# Patient Record
Sex: Female | Born: 1970
Health system: Southern US, Community
[De-identification: ages and names within clinical notes are randomized; demographics above are authoritative.]

## PROBLEM LIST (undated history)

## (undated) DIAGNOSIS — M545 Low back pain: Secondary | ICD-10-CM

## (undated) DIAGNOSIS — M3500 Sicca syndrome, unspecified: Secondary | ICD-10-CM

## (undated) DIAGNOSIS — R55 Syncope and collapse: Secondary | ICD-10-CM

## (undated) DIAGNOSIS — E119 Type 2 diabetes mellitus without complications: Secondary | ICD-10-CM

## (undated) DIAGNOSIS — I011 Acute rheumatic endocarditis: Secondary | ICD-10-CM

## (undated) DIAGNOSIS — D693 Immune thrombocytopenic purpura: Secondary | ICD-10-CM

## (undated) HISTORY — PX: HERNIA REPAIR: SHX51

## (undated) HISTORY — DX: Acute rheumatic endocarditis: I01.1

## (undated) HISTORY — DX: Syncope and collapse: R55

## (undated) HISTORY — PX: FOOT SURGERY: SHX648

## (undated) HISTORY — DX: Low back pain: M54.5

---

## 2004-02-22 ENCOUNTER — Other Ambulatory Visit: Admission: RE | Admit: 2004-02-22 | Discharge: 2004-02-22 | Payer: Self-pay | Admitting: Family Medicine

## 2004-09-20 ENCOUNTER — Emergency Department (HOSPITAL_COMMUNITY): Admission: EM | Admit: 2004-09-20 | Discharge: 2004-09-20 | Payer: Self-pay | Admitting: Emergency Medicine

## 2005-03-01 ENCOUNTER — Ambulatory Visit: Payer: Self-pay | Admitting: Oncology

## 2005-07-24 ENCOUNTER — Ambulatory Visit: Payer: Self-pay | Admitting: Oncology

## 2006-01-08 ENCOUNTER — Other Ambulatory Visit: Admission: RE | Admit: 2006-01-08 | Discharge: 2006-01-08 | Payer: Self-pay | Admitting: Family Medicine

## 2007-01-11 ENCOUNTER — Other Ambulatory Visit: Admission: RE | Admit: 2007-01-11 | Discharge: 2007-01-11 | Payer: Self-pay | Admitting: Family Medicine

## 2007-03-05 ENCOUNTER — Ambulatory Visit: Payer: Self-pay | Admitting: Oncology

## 2007-03-07 LAB — CBC WITH DIFFERENTIAL/PLATELET
Basophils Absolute: 0 10*3/uL (ref 0.0–0.1)
EOS%: 0.4 % (ref 0.0–7.0)
Eosinophils Absolute: 0 10*3/uL (ref 0.0–0.5)
HGB: 12.4 g/dL (ref 11.6–15.9)
MCV: 86.5 fL (ref 81.0–101.0)
MONO%: 9.7 % (ref 0.0–13.0)
NEUT#: 4.3 10*3/uL (ref 1.5–6.5)
RBC: 4.13 10*6/uL (ref 3.70–5.32)
RDW: 12.9 % (ref 11.3–14.5)
WBC: 5.7 10*3/uL (ref 3.9–10.0)
lymph#: 0.7 10*3/uL — ABNORMAL LOW (ref 0.9–3.3)

## 2007-03-07 LAB — CHCC SMEAR

## 2008-02-27 ENCOUNTER — Other Ambulatory Visit: Admission: RE | Admit: 2008-02-27 | Discharge: 2008-02-27 | Payer: Self-pay | Admitting: Family Medicine

## 2009-03-30 ENCOUNTER — Other Ambulatory Visit: Admission: RE | Admit: 2009-03-30 | Discharge: 2009-03-30 | Payer: Self-pay | Admitting: Obstetrics and Gynecology

## 2011-07-14 ENCOUNTER — Other Ambulatory Visit: Payer: Self-pay | Admitting: Family Medicine

## 2011-07-14 DIAGNOSIS — G44009 Cluster headache syndrome, unspecified, not intractable: Secondary | ICD-10-CM

## 2011-07-19 ENCOUNTER — Ambulatory Visit
Admission: RE | Admit: 2011-07-19 | Discharge: 2011-07-19 | Disposition: A | Discharge status: Home / Self Care | Payer: 59 | Source: Ambulatory Visit | Attending: Family Medicine | Admitting: Family Medicine

## 2011-07-19 DIAGNOSIS — G44009 Cluster headache syndrome, unspecified, not intractable: Secondary | ICD-10-CM

## 2014-09-10 ENCOUNTER — Emergency Department (HOSPITAL_BASED_OUTPATIENT_CLINIC_OR_DEPARTMENT_OTHER)
Admission: EM | Admit: 2014-09-10 | Discharge: 2014-09-10 | Disposition: A | Discharge status: Home / Self Care | Payer: BC Managed Care – PPO | Attending: Emergency Medicine | Admitting: Emergency Medicine

## 2014-09-10 ENCOUNTER — Emergency Department (HOSPITAL_BASED_OUTPATIENT_CLINIC_OR_DEPARTMENT_OTHER): Payer: BC Managed Care – PPO

## 2014-09-10 ENCOUNTER — Encounter (HOSPITAL_BASED_OUTPATIENT_CLINIC_OR_DEPARTMENT_OTHER): Payer: Self-pay

## 2014-09-10 DIAGNOSIS — M62830 Muscle spasm of back: Secondary | ICD-10-CM | POA: Diagnosis not present

## 2014-09-10 DIAGNOSIS — Z79899 Other long term (current) drug therapy: Secondary | ICD-10-CM | POA: Insufficient documentation

## 2014-09-10 DIAGNOSIS — Z880 Allergy status to penicillin: Secondary | ICD-10-CM | POA: Insufficient documentation

## 2014-09-10 DIAGNOSIS — R197 Diarrhea, unspecified: Secondary | ICD-10-CM | POA: Diagnosis not present

## 2014-09-10 DIAGNOSIS — R109 Unspecified abdominal pain: Secondary | ICD-10-CM

## 2014-09-10 DIAGNOSIS — R55 Syncope and collapse: Secondary | ICD-10-CM | POA: Diagnosis present

## 2014-09-10 DIAGNOSIS — R1030 Lower abdominal pain, unspecified: Secondary | ICD-10-CM | POA: Insufficient documentation

## 2014-09-10 DIAGNOSIS — M545 Low back pain, unspecified: Secondary | ICD-10-CM

## 2014-09-10 DIAGNOSIS — Z862 Personal history of diseases of the blood and blood-forming organs and certain disorders involving the immune mechanism: Secondary | ICD-10-CM | POA: Insufficient documentation

## 2014-09-10 DIAGNOSIS — Z3202 Encounter for pregnancy test, result negative: Secondary | ICD-10-CM | POA: Diagnosis not present

## 2014-09-10 HISTORY — DX: Immune thrombocytopenic purpura: D69.3

## 2014-09-10 HISTORY — DX: Sjogren syndrome, unspecified: M35.00

## 2014-09-10 LAB — URINALYSIS, ROUTINE W REFLEX MICROSCOPIC
Bilirubin Urine: NEGATIVE
Glucose, UA: NEGATIVE mg/dL
Hgb urine dipstick: NEGATIVE
KETONES UR: NEGATIVE mg/dL
LEUKOCYTES UA: NEGATIVE
NITRITE: NEGATIVE
PROTEIN: NEGATIVE mg/dL
SPECIFIC GRAVITY, URINE: 1.012 (ref 1.005–1.030)
Urobilinogen, UA: 0.2 mg/dL (ref 0.0–1.0)
pH: 6.5 (ref 5.0–8.0)

## 2014-09-10 LAB — CBC WITH DIFFERENTIAL/PLATELET
BASOS ABS: 0 10*3/uL (ref 0.0–0.1)
Basophils Relative: 0 % (ref 0–1)
EOS ABS: 0 10*3/uL (ref 0.0–0.7)
Eosinophils Relative: 0 % (ref 0–5)
HCT: 37.9 % (ref 36.0–46.0)
Hemoglobin: 12.5 g/dL (ref 12.0–15.0)
LYMPHS PCT: 6 % — AB (ref 12–46)
Lymphs Abs: 0.5 10*3/uL — ABNORMAL LOW (ref 0.7–4.0)
MCH: 30 pg (ref 26.0–34.0)
MCHC: 33 g/dL (ref 30.0–36.0)
MCV: 90.9 fL (ref 78.0–100.0)
MONO ABS: 0.4 10*3/uL (ref 0.1–1.0)
Monocytes Relative: 5 % (ref 3–12)
NEUTROS PCT: 89 % — AB (ref 43–77)
Neutro Abs: 7.9 10*3/uL — ABNORMAL HIGH (ref 1.7–7.7)
PLATELETS: 89 10*3/uL — AB (ref 150–400)
RBC: 4.17 MIL/uL (ref 3.87–5.11)
RDW: 14 % (ref 11.5–15.5)
WBC: 8.8 10*3/uL (ref 4.0–10.5)

## 2014-09-10 LAB — COMPREHENSIVE METABOLIC PANEL
ALBUMIN: 3.9 g/dL (ref 3.5–5.2)
ALT: 17 U/L (ref 0–35)
AST: 22 U/L (ref 0–37)
Alkaline Phosphatase: 38 U/L — ABNORMAL LOW (ref 39–117)
Anion gap: 7 (ref 5–15)
BUN: 18 mg/dL (ref 6–23)
CALCIUM: 9.6 mg/dL (ref 8.4–10.5)
CO2: 23 mmol/L (ref 19–32)
Chloride: 107 mEq/L (ref 96–112)
Creatinine, Ser: 0.77 mg/dL (ref 0.50–1.10)
GFR calc non Af Amer: >90 mL/min (ref >90)
Glucose, Bld: 107 mg/dL — ABNORMAL HIGH (ref 70–99)
Potassium: 4.3 mmol/L (ref 3.5–5.1)
SODIUM: 137 mmol/L (ref 135–145)
TOTAL PROTEIN: 7.6 g/dL (ref 6.0–8.3)
Total Bilirubin: 0.6 mg/dL (ref 0.3–1.2)

## 2014-09-10 LAB — PREGNANCY, URINE: PREG TEST UR: NEGATIVE

## 2014-09-10 MED ORDER — IOHEXOL 300 MG/ML  SOLN
25.0000 mL | Freq: Once | INTRAMUSCULAR | Status: AC | PRN
  Administered 2014-09-10: 25 mL via ORAL

## 2014-09-10 MED ORDER — CYCLOBENZAPRINE HCL 5 MG PO TABS: 5.0000 mg | ORAL_TABLET | Freq: Three times a day (TID) | ORAL | Status: DC | PRN

## 2014-09-10 MED ORDER — IOHEXOL 300 MG/ML  SOLN
100.0000 mL | Freq: Once | INTRAMUSCULAR | Status: AC | PRN
  Administered 2014-09-10: 100 mL via INTRAVENOUS

## 2014-09-10 MED ORDER — HYDROCODONE-ACETAMINOPHEN 5-325 MG PO TABS
1.0000 | ORAL_TABLET | Freq: Once | ORAL | Status: AC
  Administered 2014-09-10: 1 via ORAL
  Filled 2014-09-10: qty 1

## 2014-09-10 MED ORDER — HYDROCODONE-ACETAMINOPHEN 5-325 MG PO TABS: 1.0000 | ORAL_TABLET | Freq: Once | ORAL | Status: DC

## 2014-09-10 MED ORDER — ONDANSETRON HCL 4 MG/2ML IJ SOLN
4.0000 mg | Freq: Once | INTRAMUSCULAR | Status: AC
  Administered 2014-09-10: 4 mg via INTRAVENOUS
  Filled 2014-09-10: qty 2

## 2014-09-10 MED ORDER — MORPHINE SULFATE 4 MG/ML IJ SOLN
4.0000 mg | Freq: Once | INTRAMUSCULAR | Status: AC
  Administered 2014-09-10: 4 mg via INTRAVENOUS
  Filled 2014-09-10: qty 1

## 2014-09-10 MED ORDER — DIAZEPAM 5 MG PO TABS
5.0000 mg | ORAL_TABLET | Freq: Once | ORAL | Status: AC
  Administered 2014-09-10: 5 mg via ORAL
  Filled 2014-09-10: qty 1

## 2014-09-10 NOTE — ED Notes (Signed)
Patient preferred to walk to room rather than by wheelchair. Patient states she is unable to sit in chair, just by side of bed, unable to lay flat.

## 2014-09-10 NOTE — ED Provider Notes (Signed)
CSN: [id number]     Arrival date & time 09/10/14  1257 History   First MD Initiated Contact with Patient 09/10/14 1332     Chief Complaint  Patient presents with  . Near Syncope     (Consider location/radiation/quality/duration/timing/severity/associated sxs/prior Treatment) HPI Comments: Pt states she has some low back pain this morning when she got up.  She got in the shower and suddenly she developed a severe pain in the lower back and lower abd with near syncope (vision going black, nausea, sweating, turning pale and diarrhea).  The severe pain resolved after a few min but she had multiple episodes over the last 2 hours.  Pain is worse with movement and walking but a severe episode can happen with sitting still.  More pelvic pain bilaterally with sitting.  No prior episodes of pain like this.  No prior hx of pelvic pathology.  No urinary sx, fever or bloody stool  Patient is a 43 y.o. female presenting with near-syncope. The history is provided by the patient.  Near Syncope This is a new problem. The current episode started 1 to 2 hours ago. Episode frequency: 3-4 episodes over the last 2 hours. The problem has not changed since onset.Associated symptoms include abdominal pain. Pertinent negatives include no chest pain and no shortness of breath. The symptoms are aggravated by walking and bending. Nothing relieves the symptoms. She has tried rest for the symptoms. The treatment provided no relief.    Past Medical History  Diagnosis Date  . ITP (idiopathic thrombocytopenic purpura)   . Sjoegren syndrome    Past Surgical History  Procedure Laterality Date  . Hernia repair    . Foot surgery     No family history on file. History  Substance Use Topics  . Smoking status: Never Smoker   . Smokeless tobacco: Not on file  . Alcohol Use: No   OB History    No data available     Review of Systems  Constitutional: Negative for fever and chills.  Respiratory: Negative for cough and  shortness of breath.   Cardiovascular: Positive for near-syncope. Negative for chest pain and leg swelling.  Gastrointestinal: Positive for abdominal pain.  Genitourinary: Negative for dysuria and difficulty urinating.  All other systems reviewed and are negative.     Allergies  Penicillins  Home Medications   Prior to Admission medications   Medication Sig Start Date End Date Taking? Authorizing Provider  hydroxychloroquine (PLAQUENIL) 200 MG tablet Take 200 mg by mouth daily.   Yes Historical Provider, MD   BP 100/58 mmHg  Pulse 86  Temp(Src) 99 F (37.2 C) (Oral)  Resp 20  Ht 5\' 5"  (1.651 m)  Wt 125 lb (56.7 kg)  BMI 20.80 kg/m2  SpO2 100%  LMP 08/26/2014 (Approximate) Physical Exam  Constitutional: She is oriented to person, place, and time. She appears well-developed and well-nourished. No distress.  Appears uncomfortable on exam especially with movement  HENT:  Head: Normocephalic and atraumatic.  Mouth/Throat: Oropharynx is clear and moist.  Eyes: Conjunctivae and EOM are normal. Pupils are equal, round, and reactive to light.  Neck: Normal range of motion. Neck supple.  Cardiovascular: Normal rate, regular rhythm and intact distal pulses.   No murmur heard. Pulmonary/Chest: Effort normal and breath sounds normal. No respiratory distress. She has no wheezes. She has no rales.  Abdominal: Soft. She exhibits no distension. There is tenderness in the suprapubic area. There is CVA tenderness. There is no rebound and no guarding.  Suprapubic and pelvic tenderness.  Only mild upper abd tenderness.  Mild CVA tenderness  Musculoskeletal: Normal range of motion. She exhibits no edema or tenderness.       Back:  Neurological: She is alert and oriented to person, place, and time.  Skin: Skin is warm and dry. No rash noted. No erythema.  Psychiatric: She has a normal mood and affect. Her behavior is normal.  Nursing note and vitals reviewed.   ED Course  Procedures  (including critical care time) Labs Review Labs Reviewed  CBC WITH DIFFERENTIAL - Abnormal; Notable for the following:    Platelets 89 (*)    Neutrophils Relative % 89 (*)    Lymphocytes Relative 6 (*)    Neutro Abs 7.9 (*)    Lymphs Abs 0.5 (*)    All other components within normal limits  COMPREHENSIVE METABOLIC PANEL - Abnormal; Notable for the following:    Glucose, Bld 107 (*)    Alkaline Phosphatase 38 (*)    All other components within normal limits  URINALYSIS, ROUTINE W REFLEX MICROSCOPIC  PREGNANCY, URINE    Imaging Review No results found.   EKG Interpretation   Date/Time:  Thursday September 10 2014 14:17:04 EST Ventricular Rate:  76 PR Interval:  172 QRS Duration: 78 QT Interval:  360 QTC Calculation: 405 R Axis:   81 Text Interpretation:  Normal sinus rhythm Low voltage QRS No significant  change since last tracing Confirmed by Anitra Lauth  MD, Alphonzo Lemmings ([id number]) on  09/10/2014 2:29:00 PM      MDM   Final diagnoses:  None    Patient with symptoms that she describes as severe back spasms causing diarrhea and near syncope that have been intermittent since 10 AM. On exam patient appears uncomfortable and has significant pain with walking and movement. She is also complaining of mild pelvic pain. She denies any urinary complaints and no blood in her stool. She has had history of lower back pain in the past but never any pain like this. She denies any lower extremity weakness is hemodynamically stable. Patient does have a history of Sjogren's disease and ITP. She takes plaquenil daily.  Pain is not typical of a kidney stone as it is bilateral and she denies any urinary symptoms which make it unusual for pyelonephritis. With the diarrhea near syncope and intermittent nature of the pain but a persistent dull pain concern for possible abdominal pathology such as perforation, ovarian cyst rupture could be musculoskeletal back pain.  UA is within normal limits and EKG is  normal. We'll do a CT scan of the abdomen and pelvis to further evaluate to rule out any aortic pathology, intestinal pathology or evidence of free fluid or perforation. Low suspicion for spinal pathology  Pt checked out to Dr. Karma Ganja at 1600.   Gwyneth Sprout, MD 09/11/14 [phone]

## 2014-09-10 NOTE — ED Notes (Signed)
Pt preferred to walk to tx area due to pain when seated in w/c with feet on pedals-EMT escort to tx area with slow steady gait

## 2014-09-10 NOTE — Discharge Instructions (Signed)
Return to the ED with any concerns including vomiting and not able to keep down liquids, worsening pain, weakness of legs, not able to urinate, loss of control of bowel or bladder, decreased level of alertness/lethargy, fever, or any other alarming symptoms

## 2014-09-10 NOTE — ED Notes (Signed)
C/o back pain started while in shower-felt as if she was going to pass out-nausea and diarrhea-s/s 10am

## 2014-09-14 ENCOUNTER — Encounter (HOSPITAL_COMMUNITY): Payer: Self-pay | Admitting: Emergency Medicine

## 2014-09-14 ENCOUNTER — Emergency Department (HOSPITAL_COMMUNITY)
Admission: EM | Admit: 2014-09-14 | Discharge: 2014-09-14 | Disposition: A | Discharge status: Home / Self Care | Payer: BC Managed Care – PPO | Attending: Emergency Medicine | Admitting: Emergency Medicine

## 2014-09-14 DIAGNOSIS — M545 Low back pain: Secondary | ICD-10-CM | POA: Diagnosis present

## 2014-09-14 DIAGNOSIS — Z79899 Other long term (current) drug therapy: Secondary | ICD-10-CM | POA: Diagnosis not present

## 2014-09-14 DIAGNOSIS — M5442 Lumbago with sciatica, left side: Secondary | ICD-10-CM | POA: Diagnosis not present

## 2014-09-14 DIAGNOSIS — Z880 Allergy status to penicillin: Secondary | ICD-10-CM | POA: Insufficient documentation

## 2014-09-14 DIAGNOSIS — M5441 Lumbago with sciatica, right side: Secondary | ICD-10-CM | POA: Diagnosis not present

## 2014-09-14 DIAGNOSIS — M62830 Muscle spasm of back: Secondary | ICD-10-CM

## 2014-09-14 MED ORDER — LORAZEPAM 1 MG PO TABS
1.0000 mg | ORAL_TABLET | Freq: Once | ORAL | Status: AC
  Administered 2014-09-14: 1 mg via ORAL
  Filled 2014-09-14: qty 1

## 2014-09-14 MED ORDER — OXYCODONE-ACETAMINOPHEN 5-325 MG PO TABS: 2.0000 | ORAL_TABLET | Freq: Four times a day (QID) | ORAL | Status: DC | PRN

## 2014-09-14 MED ORDER — PREDNISONE 20 MG PO TABS: 40.0000 mg | ORAL_TABLET | Freq: Every day | ORAL | Status: DC

## 2014-09-14 MED ORDER — PREDNISONE 20 MG PO TABS
40.0000 mg | ORAL_TABLET | Freq: Once | ORAL | Status: AC
Start: 2014-09-14 — End: 2014-09-14
  Administered 2014-09-14: 40 mg via ORAL
  Filled 2014-09-14 (×2): qty 2

## 2014-09-14 MED ORDER — HYDROMORPHONE HCL 1 MG/ML IJ SOLN
1.0000 mg | Freq: Once | INTRAMUSCULAR | Status: AC
  Administered 2014-09-14: 1 mg via INTRAMUSCULAR
  Filled 2014-09-14: qty 1

## 2014-09-14 MED ORDER — DIAZEPAM 2 MG PO TABS: 2.0000 mg | ORAL_TABLET | Freq: Three times a day (TID) | ORAL | Status: DC | PRN

## 2014-09-14 NOTE — Discharge Instructions (Signed)
Call for a follow up appointment with a Family or Primary Care Provider.  Return if Symptoms worsen.   Take medication as prescribed.  Intermittent heat compress and ice compress for discomfort. Do gentle stretching, range of motion with low back to promote blood flow.

## 2014-09-14 NOTE — ED Notes (Signed)
Pt c/o back pain in middle area that is intermittent and has some urinary incontinence per pt

## 2014-09-14 NOTE — ED Notes (Signed)
NAD at this time. Pt is leaving with her mother

## 2014-09-14 NOTE — ED Provider Notes (Signed)
CSN: [id number]     Arrival date & time 09/14/14  1053 History   First MD Initiated Contact with Patient 09/14/14 1438     Chief Complaint  Patient presents with  . Back Pain     (Consider location/radiation/quality/duration/timing/severity/associated sxs/prior Treatment) HPI Comments: The patient is a 43 year old female with a past female history of Sjoegren syndrome, presenting from primary care provider with a chief complaint of persistent low back pain with radiation to lower extremities. Patient reports symptoms worsen with movement. Reports pain and weakness and lower extremities, left greater than right. She reports initially being evaluated for symptoms starting 12/24. She reports while in shower after a movement to her left side she had a breath onset of low back pain and "feeling of loss of legs bilaterally. She reports multiple episodes of syncope and near syncope. She reports urinary and fecal incontinence with syncopal episodes 4 days ago. Denies urinary incontinence or fecal incontinence since. Patient denies symptom worsening, reports pain and discomfort as "widening" stating feeling it and more areas, with pain radiation into the spine. Reports spasms (pain) have decreased in length. Denies saddle anesthesia, previous back surgeries. Fever, chills. PCP Dr. Hyacinth Meeker, evaluated by Nnodi today in clinic.  Patient is a 43 y.o. female presenting with back pain. The history is provided by the patient and medical records. No language interpreter was used.  Back Pain   Past Medical History  Diagnosis Date  . ITP (idiopathic thrombocytopenic purpura)   . Sjoegren syndrome    Past Surgical History  Procedure Laterality Date  . Hernia repair    . Foot surgery     History reviewed. No pertinent family history. History  Substance Use Topics  . Smoking status: Never Smoker   . Smokeless tobacco: Not on file  . Alcohol Use: No   OB History    No data available     Review of  Systems  Musculoskeletal: Positive for back pain.      Allergies  Penicillins  Home Medications   Prior to Admission medications   Medication Sig Start Date End Date Taking? Authorizing Provider  cyclobenzaprine (FLEXERIL) 5 MG tablet Take 1 tablet (5 mg total) by mouth 3 (three) times daily as needed for muscle spasms. 09/10/14   Ethelda Chick, MD  HYDROcodone-acetaminophen (NORCO/VICODIN) 5-325 MG per tablet Take 1 tablet by mouth once. 09/10/14   Ethelda Chick, MD  hydroxychloroquine (PLAQUENIL) 200 MG tablet Take 200 mg by mouth daily.    Historical Provider, MD   BP 96/79 mmHg  Pulse 80  Temp(Src) 98.4 F (36.9 C) (Oral)  Resp 20  SpO2 99%  LMP 08/26/2014 (Approximate) Physical Exam  Constitutional: She appears well-developed and well-nourished. No distress.  Cardiovascular: Normal rate and regular rhythm.   Pulmonary/Chest: Effort normal and breath sounds normal. No respiratory distress. She has no wheezes. She has no rales.  Abdominal: Soft. Normal appearance. There is generalized tenderness. There is no rebound and no guarding.  Genitourinary:  Normal rectal tone.  Musculoskeletal:       Back:  Small spasm noted.  Neurological: She is alert. She has normal strength. She is not disoriented. No cranial nerve deficit. She exhibits normal muscle tone.  Reflex Scores:      Patellar reflexes are 3+ on the right side and 3+ on the left side.      Achilles reflexes are 1+ on the right side and 1+ on the left side. Normal sensation and strength to bilateral lower extremities.  Skin: Skin is warm and dry. She is not diaphoretic.  Psychiatric: She has a normal mood and affect. Her behavior is normal.  Nursing note and vitals reviewed.   ED Course  Procedures (including critical care time) Labs Review Labs Reviewed - No data to display  Imaging Review No results found.   EKG Interpretation None      MDM   Final diagnoses:  Bilateral low back pain with  sciatica, sciatica laterality unspecified  Muscle spasm of back   Patient presents from clinic with low back pain with radiation to extremities, worse with movement, recently evaluated with urinary incontinence and bowel incontinence resolved. Patient has mild hyperreflexia in bilateral lower extremities, normal sensation, normal rectal tone, bladder scan 46 mL. Discussed with Dr. Ihor Dow, the provider who evaluated the patient today and sent her to the ED.  States she sent for an MRI.  Reports she was hesitant to schedule MRI as an outpatient due to unable to contact a neuro surgeon emergently. Discussed normal sensation, strength, mild hyperreflexia, normal rectal tone, resolving urinary incontinence, bladder scan of 46 mL. And patient can be managed as outpatient with pain medicine, muscle relaxer, antiinflammatory  Reevaluation patient reports moderate resolution of symptoms. Only experiences low back pain with movement at this time. Discussed at length treatment plan with the patient and patient's mother. Strict return precautions given.  Meds given in ED:  Medications  LORazepam (ATIVAN) tablet 1 mg (1 mg Oral Given 09/14/14 1534)  HYDROmorphone (DILAUDID) injection 1 mg (1 mg Intramuscular Given 09/14/14 1534)  predniSONE (DELTASONE) tablet 40 mg (40 mg Oral Given 09/14/14 1618)    Discharge Medication List as of 09/14/2014  5:02 PM    START taking these medications   Details  diazepam (VALIUM) 2 MG tablet Take 1 tablet (2 mg total) by mouth every 8 (eight) hours as needed for muscle spasms., Starting 09/14/2014, Until Discontinued, Print    oxyCODONE-acetaminophen (PERCOCET/ROXICET) 5-325 MG per tablet Take 2 tablets by mouth every 6 (six) hours as needed for moderate pain or severe pain., Starting 09/14/2014, Until Discontinued, Print    predniSONE (DELTASONE) 20 MG tablet Take 2 tablets (40 mg total) by mouth daily. Take 40 mg by mouth daily for 3 days, then 20mg  by mouth daily for 3  days, then 10mg  daily for 3 days, Starting 09/14/2014, Until Discontinued, Print        , PA-C 09/15/14 1422  Mellody Drown, MD 09/15/14 [phone]

## 2014-11-20 ENCOUNTER — Other Ambulatory Visit (HOSPITAL_COMMUNITY)
Admission: RE | Admit: 2014-11-20 | Discharge: 2014-11-20 | Disposition: A | Discharge status: Home / Self Care | Payer: BLUE CROSS/BLUE SHIELD | Source: Ambulatory Visit | Attending: Family Medicine | Admitting: Family Medicine

## 2014-11-20 ENCOUNTER — Other Ambulatory Visit: Payer: Self-pay | Admitting: Family Medicine

## 2014-11-20 DIAGNOSIS — Z1151 Encounter for screening for human papillomavirus (HPV): Secondary | ICD-10-CM | POA: Diagnosis present

## 2014-11-20 DIAGNOSIS — Z124 Encounter for screening for malignant neoplasm of cervix: Secondary | ICD-10-CM | POA: Insufficient documentation

## 2014-11-23 ENCOUNTER — Other Ambulatory Visit: Payer: Self-pay | Admitting: Internal Medicine

## 2014-11-23 DIAGNOSIS — R1310 Dysphagia, unspecified: Secondary | ICD-10-CM

## 2014-11-23 LAB — CYTOLOGY - PAP

## 2014-11-27 ENCOUNTER — Ambulatory Visit
Admission: RE | Admit: 2014-11-27 | Discharge: 2014-11-27 | Disposition: A | Discharge status: Home / Self Care | Payer: BLUE CROSS/BLUE SHIELD | Source: Ambulatory Visit | Attending: Internal Medicine | Admitting: Internal Medicine

## 2014-11-27 DIAGNOSIS — R1310 Dysphagia, unspecified: Secondary | ICD-10-CM

## 2016-01-19 DIAGNOSIS — M545 Low back pain: Secondary | ICD-10-CM | POA: Diagnosis not present

## 2016-01-27 ENCOUNTER — Other Ambulatory Visit: Payer: Self-pay | Admitting: Family Medicine

## 2016-01-27 DIAGNOSIS — M5416 Radiculopathy, lumbar region: Secondary | ICD-10-CM

## 2016-02-02 ENCOUNTER — Ambulatory Visit: Payer: BLUE CROSS/BLUE SHIELD | Admitting: Neurology

## 2016-02-04 ENCOUNTER — Ambulatory Visit
Admission: RE | Admit: 2016-02-04 | Discharge: 2016-02-04 | Disposition: A | Discharge status: Home / Self Care | Payer: BLUE CROSS/BLUE SHIELD | Source: Ambulatory Visit | Attending: Family Medicine | Admitting: Family Medicine

## 2016-02-04 DIAGNOSIS — M5416 Radiculopathy, lumbar region: Secondary | ICD-10-CM

## 2016-02-04 DIAGNOSIS — M5126 Other intervertebral disc displacement, lumbar region: Secondary | ICD-10-CM | POA: Diagnosis not present

## 2016-02-04 MED ORDER — GADOBENATE DIMEGLUMINE 529 MG/ML IV SOLN
10.0000 mL | Freq: Once | INTRAVENOUS | Status: AC | PRN
  Administered 2016-02-04: 10 mL via INTRAVENOUS

## 2016-02-09 ENCOUNTER — Telehealth: Payer: Self-pay | Admitting: Neurology

## 2016-02-09 NOTE — Telephone Encounter (Signed)
MRI lumbar scan was done showing mild disc degeneration at the L3-4 level with a small central disc protrusion and mild bilateral lateral recess stenosis at this level. The patient will be seen through this office on May 30.

## 2016-02-10 DIAGNOSIS — M5440 Lumbago with sciatica, unspecified side: Secondary | ICD-10-CM | POA: Diagnosis not present

## 2016-02-10 DIAGNOSIS — M3500 Sicca syndrome, unspecified: Secondary | ICD-10-CM | POA: Diagnosis not present

## 2016-02-10 DIAGNOSIS — R21 Rash and other nonspecific skin eruption: Secondary | ICD-10-CM | POA: Diagnosis not present

## 2016-02-15 ENCOUNTER — Ambulatory Visit (INDEPENDENT_AMBULATORY_CARE_PROVIDER_SITE_OTHER): Payer: BLUE CROSS/BLUE SHIELD | Admitting: Neurology

## 2016-02-15 ENCOUNTER — Encounter: Payer: Self-pay | Admitting: Neurology

## 2016-02-15 VITALS — BP 112/64 | HR 80 | Ht 69.0 in | Wt 124.0 lb

## 2016-02-15 DIAGNOSIS — M545 Low back pain, unspecified: Secondary | ICD-10-CM

## 2016-02-15 DIAGNOSIS — R202 Paresthesia of skin: Secondary | ICD-10-CM

## 2016-02-15 DIAGNOSIS — M5442 Lumbago with sciatica, left side: Secondary | ICD-10-CM | POA: Diagnosis not present

## 2016-02-15 DIAGNOSIS — Z5181 Encounter for therapeutic drug level monitoring: Secondary | ICD-10-CM | POA: Diagnosis not present

## 2016-02-15 DIAGNOSIS — M5441 Lumbago with sciatica, right side: Secondary | ICD-10-CM

## 2016-02-15 HISTORY — DX: Low back pain, unspecified: M54.50

## 2016-02-15 NOTE — Progress Notes (Signed)
Reason for visit: Back pain  Referring physician: Dr. Melina Schools is a 45 y.o. female  History of present illness:  Sarah Garza is a 45 year old right-handed white female with a history of 2 episodes in the past of severe back pain. The patient had the first event in December 2015. She stooped over to pick up something, and then noted onset of severe back pain with a shocking sensation that went into the back bilaterally and down into the hips and upper thighs. The patient had dizziness with this and had an event of syncope. The patient had severe pain for about 9 days, she was out of work for about 2 weeks. She was unable to do much activity during this period of time. The patient had some loss of bowel and bladder control during the most severe pain. The patient seemed to improve, but she has a baseline discomfort in the low back going into the hips and occasionally down both legs to the feet. The patient has had recurrence of a similar event on 01/16/2016. The patient had severe pain for about 5 days with this, the severe shocking sensations and sensation of near-syncope lasted only about 15-20 minutes after onset. The patient continues to have ongoing low back pain, she will have occasional episodes of paresthesias involving all 4 extremities, much worse in the legs. The bowel and bladder control issues have improved. The patient denies any true weakness, she denies any significant balance issues. She denies any paresthesias on the face or head. There have been no visual changes. She comes to this office for an evaluation. MRI evaluation of the lumbar spine was done and was relatively unremarkable, nothing that would explain her current symptoms. She does have a history of Sjogren syndrome followed by rheumatology.  Past Medical History  Diagnosis Date  . ITP (idiopathic thrombocytopenic purpura)   . Sjoegren syndrome (HCC)   . Low back pain 02/15/2016  . Rheumatoid aortitis  (HCC)   . Syncope     Past Surgical History  Procedure Laterality Date  . Hernia repair    . Foot surgery      Family History  Problem Relation Age of Onset  . COPD Father   . Cirrhosis Father   . Sjogren's syndrome Sister   . Hypothyroidism Mother   . Rheum arthritis Sister     Social history:  reports that she has never smoked. She does not have any smokeless tobacco history on file. She reports that she does not drink alcohol or use illicit drugs.  Medications:  Prior to Admission medications   Medication Sig Start Date End Date Taking? Authorizing Provider  cyclobenzaprine (FLEXERIL) 5 MG tablet Take 1 tablet (5 mg total) by mouth 3 (three) times daily as needed for muscle spasms. 09/10/14  Yes Jerelyn Scott, MD  diazepam (VALIUM) 2 MG tablet Take 1 tablet (2 mg total) by mouth every 8 (eight) hours as needed for muscle spasms. 09/14/14  Yes Mellody Drown, PA-C  hydroxychloroquine (PLAQUENIL) 200 MG tablet Take 200 mg by mouth daily.   Yes Historical Provider, MD  naproxen sodium (ANAPROX) 220 MG tablet Take 220 mg by mouth 2 (two) times daily with a meal.   Yes Historical Provider, MD      Allergies  Allergen Reactions  . Levaquin [Levofloxacin] Other (See Comments)    Joint pain  . Penicillins Hives    ROS:  Out of a complete 14 system review of symptoms, the patient  complains only of the following symptoms, and all other reviewed systems are negative.  Swelling in the legs Incontinence of the bowel and bladder Muscle cramps, aching muscles Weakness in the limbs Passing out Restless legs   Blood pressure 112/64, pulse 80, height 5\' 9"  (1.753 m), weight 124 lb (56.246 kg).  Physical Exam  General: The patient is alert and cooperative at the time of the examination.  Eyes: Pupils are equal, round, and reactive to light. Discs are flat bilaterally.  Neck: The neck is supple, no carotid bruits are noted.  Respiratory: The respiratory examination is  clear.  Cardiovascular: The cardiovascular examination reveals a regular rate and rhythm, no obvious murmurs or rubs are noted.  Neuromuscular: Range of movement the cervical and lumbar spine is full.  Skin: Extremities are without significant edema.  Neurologic Exam  Mental status: The patient is alert and oriented x 3 at the time of the examination. The patient has apparent normal recent and remote memory, with an apparently normal attention span and concentration ability.  Cranial nerves: Facial symmetry is present. There is good sensation of the face to pinprick and soft touch bilaterally. The strength of the facial muscles and the muscles to head turning and shoulder shrug are normal bilaterally. Speech is well enunciated, no aphasia or dysarthria is noted. Extraocular movements are full. Visual fields are full. The tongue is midline, and the patient has symmetric elevation of the soft palate. No obvious hearing deficits are noted.  Motor: The motor testing reveals 5 over 5 strength of all 4 extremities. Good symmetric motor tone is noted throughout.  Sensory: Sensory testing is intact to pinprick, soft touch, vibration sensation, and position sense on all 4 extremities. No evidence of extinction is noted.  Coordination: Cerebellar testing reveals good finger-nose-finger and heel-to-shin bilaterally.  Gait and station: Gait is normal. Tandem gait is normal. Romberg is negative. No drift is seen.  Reflexes: Deep tendon reflexes are symmetric and normal bilaterally. Toes are downgoing bilaterally.   MRI lumbar 02/04/16:  IMPRESSION: Mild disc degeneration at L3-4 with small central disc protrusion and mild bilateral lateral recess stenosis.  * MRI scan images were reviewed online. I agree with the written report.    Assessment/Plan:  1. Chronic low back pain   2. Syncope and near-syncope, possible vasovagal   3. Paresthesias, all 4 extremities   The patient reports severe  rapid onset of back pain that likely represents muscle spasm, but the patient reports transient episodes of shock sensations down the legs, bowel and bladder dysfunction, and paresthesias of all 4 extremities. The symptoms could be consistent with anything that affects the spinal cord. The patient will need full evaluation of the cervical and thoracic spine. The patient will have blood work done today. MRI will be set up of the neck and mid back. I will contact the patient concerning the above results. 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    02/06/16 MD 02/15/2016 7:56 PM  [facility] [address] [place], [place]  Phone [phone] Fax [phone]

## 2016-02-15 NOTE — Patient Instructions (Signed)
Paresthesia Paresthesia is an abnormal burning or prickling sensation. This sensation is generally felt in the hands, arms, legs, or feet. However, it may occur in any part of the body. Usually, it is not painful. The feeling may be described as:  Tingling or numbness.  Pins and needles.  Skin crawling.  Buzzing.  Limbs falling asleep.  Itching. Most people experience temporary (transient) paresthesia at some time in their lives. Paresthesia may occur when you breathe too quickly (hyperventilation). It can also occur without any apparent cause. Commonly, paresthesia occurs when pressure is placed on a nerve. The sensation quickly goes away after the pressure is removed. For some people, however, paresthesia is a long-lasting (chronic) condition that is caused by an underlying disorder. If you continue to have paresthesia, you may need further medical evaluation. HOME CARE INSTRUCTIONS Watch your condition for any changes. Taking the following actions may help to lessen any discomfort that you are feeling:  Avoid drinking alcohol.  Try acupuncture or massage to help relieve your symptoms.  Keep all follow-up visits as directed by your health care provider. This is important. SEEK MEDICAL CARE IF:  You continue to have episodes of paresthesia.  Your burning or prickling feeling gets worse when you walk.  You have pain, cramps, or dizziness.  You develop a rash. SEEK IMMEDIATE MEDICAL CARE IF:  You feel weak.  You have trouble walking or moving.  You have problems with speech, understanding, or vision.  You feel confused.  You cannot control your bladder or bowel movements.  You have numbness after an injury.  You faint.   This information is not intended to replace advice given to you by your health care provider. Make sure you discuss any questions you have with your health care provider.   Document Released: 08/25/2002 Document Revised: 01/19/2015 Document Reviewed:  08/31/2014 Elsevier Interactive Patient Education 2016 [employer]  

## 2016-02-17 ENCOUNTER — Telehealth: Payer: Self-pay | Admitting: *Deleted

## 2016-02-17 ENCOUNTER — Telehealth: Payer: Self-pay

## 2016-02-17 LAB — CBC WITH DIFFERENTIAL/PLATELET
BASOS: 1 %
Basophils Absolute: 0 10*3/uL (ref 0.0–0.2)
EOS (ABSOLUTE): 0.1 10*3/uL (ref 0.0–0.4)
EOS: 1 %
HEMATOCRIT: 41.1 % (ref 34.0–46.6)
HEMOGLOBIN: 13.2 g/dL (ref 11.1–15.9)
IMMATURE GRANS (ABS): 0 10*3/uL (ref 0.0–0.1)
IMMATURE GRANULOCYTES: 0 %
LYMPHS: 20 %
Lymphocytes Absolute: 1 10*3/uL (ref 0.7–3.1)
MCH: 29.1 pg (ref 26.6–33.0)
MCHC: 32.1 g/dL (ref 31.5–35.7)
MCV: 91 fL (ref 79–97)
MONOS ABS: 0.5 10*3/uL (ref 0.1–0.9)
Monocytes: 10 %
NEUTROS PCT: 68 %
Neutrophils Absolute: 3.3 10*3/uL (ref 1.4–7.0)
Platelets: 118 10*3/uL — ABNORMAL LOW (ref 150–379)
RBC: 4.54 x10E6/uL (ref 3.77–5.28)
RDW: 13.7 % (ref 12.3–15.4)
WBC: 4.8 10*3/uL (ref 3.4–10.8)

## 2016-02-17 LAB — COMPREHENSIVE METABOLIC PANEL
ALBUMIN: 4.2 g/dL (ref 3.5–5.5)
ALT: 28 IU/L (ref 0–32)
AST: 23 IU/L (ref 0–40)
Albumin/Globulin Ratio: 1.4 (ref 1.2–2.2)
Alkaline Phosphatase: 50 IU/L (ref 39–117)
BUN/Creatinine Ratio: 18 (ref 9–23)
BUN: 16 mg/dL (ref 6–24)
Bilirubin Total: 0.6 mg/dL (ref 0.0–1.2)
CALCIUM: 9.3 mg/dL (ref 8.7–10.2)
CO2: 22 mmol/L (ref 18–29)
CREATININE: 0.89 mg/dL (ref 0.57–1.00)
Chloride: 103 mmol/L (ref 96–106)
GFR calc Af Amer: 91 mL/min/{1.73_m2} (ref >59)
GFR, EST NON AFRICAN AMERICAN: 79 mL/min/{1.73_m2} (ref >59)
GLOBULIN, TOTAL: 2.9 g/dL (ref 1.5–4.5)
Glucose: 94 mg/dL (ref 65–99)
Potassium: 4.8 mmol/L (ref 3.5–5.2)
SODIUM: 140 mmol/L (ref 134–144)
Total Protein: 7.1 g/dL (ref 6.0–8.5)

## 2016-02-17 LAB — RPR: RPR Ser Ql: NONREACTIVE

## 2016-02-17 LAB — SEDIMENTATION RATE: Sed Rate: 4 mm/hr (ref 0–32)

## 2016-02-17 LAB — B. BURGDORFI ANTIBODIES: Lyme IgG/IgM Ab: <0.91 {ISR} (ref 0.00–0.90)

## 2016-02-17 LAB — VITAMIN B12: VITAMIN B 12: 657 pg/mL (ref 211–946)

## 2016-02-17 LAB — C-REACTIVE PROTEIN: CRP: 0.7 mg/L (ref 0.0–4.9)

## 2016-02-17 LAB — COPPER, SERUM: Copper: 103 ug/dL (ref 72–166)

## 2016-02-17 NOTE — Telephone Encounter (Signed)
Message For: OFFICE               Taken  1-JUN-17 at  9:31AM by DEF ------------------------------------------------------------ Lollie Sails Borre            CID [id number]  Patient SAME                 Pt's Dr Anne Hahn       Area Code 336 Phone# [phone] * DOB [phone]     RE PRIOR AUTHORIZATION FOR 2 MRI'S,HAS BENEFIT       PKG CAN EMAIL                                        Disp:Y/N N If Y = C/B If No Response In ============================================================

## 2016-02-17 NOTE — Telephone Encounter (Signed)
Called pt w/ unremarkable lab results except for low plt (which are stable). May call back w/ questions/concerns.

## 2016-02-17 NOTE — Telephone Encounter (Signed)
-----   Message from York Spaniel, MD sent at 02/17/2016  9:02 AM EDT ----- Blood work is unremarkable with exception of a stable low platelet level. Please call the patient. ----- Message -----    From: [facility] Lab Results In Interface    Sent: 02/16/2016   7:42 AM      To: York Spaniel, MD

## 2016-02-18 NOTE — Telephone Encounter (Signed)
Spoke with patient, she thinks there are pending charges for her deductible and wants to call insurance before scheduling.

## 2016-02-24 ENCOUNTER — Telehealth: Payer: Self-pay | Admitting: Neurology

## 2016-02-24 NOTE — Telephone Encounter (Signed)
No rheumatology referral orders at this time. May call back w/ further questions.

## 2016-02-24 NOTE — Telephone Encounter (Signed)
Lori/ [facility], Dr. Dierdre Forth [phone] called to advise, their office received progress notes, not sure why? States, if patient is being referred to their office, will need demographics and insurance information.

## 2016-06-08 DIAGNOSIS — R921 Mammographic calcification found on diagnostic imaging of breast: Secondary | ICD-10-CM | POA: Diagnosis not present

## 2016-09-01 DIAGNOSIS — Z79899 Other long term (current) drug therapy: Secondary | ICD-10-CM | POA: Diagnosis not present

## 2016-09-04 DIAGNOSIS — D2271 Melanocytic nevi of right lower limb, including hip: Secondary | ICD-10-CM | POA: Diagnosis not present

## 2016-09-04 DIAGNOSIS — D225 Melanocytic nevi of trunk: Secondary | ICD-10-CM | POA: Diagnosis not present

## 2016-09-04 DIAGNOSIS — D2262 Melanocytic nevi of left upper limb, including shoulder: Secondary | ICD-10-CM | POA: Diagnosis not present

## 2016-09-04 DIAGNOSIS — Z85828 Personal history of other malignant neoplasm of skin: Secondary | ICD-10-CM | POA: Diagnosis not present

## 2016-09-04 DIAGNOSIS — D485 Neoplasm of uncertain behavior of skin: Secondary | ICD-10-CM | POA: Diagnosis not present

## 2016-09-04 DIAGNOSIS — Z85820 Personal history of malignant melanoma of skin: Secondary | ICD-10-CM | POA: Diagnosis not present

## 2016-10-23 DIAGNOSIS — M3500 Sicca syndrome, unspecified: Secondary | ICD-10-CM | POA: Diagnosis not present

## 2016-10-23 DIAGNOSIS — M5440 Lumbago with sciatica, unspecified side: Secondary | ICD-10-CM | POA: Diagnosis not present

## 2016-10-23 DIAGNOSIS — D696 Thrombocytopenia, unspecified: Secondary | ICD-10-CM | POA: Diagnosis not present

## 2016-10-23 DIAGNOSIS — R21 Rash and other nonspecific skin eruption: Secondary | ICD-10-CM | POA: Diagnosis not present

## 2017-01-29 DIAGNOSIS — Z0000 Encounter for general adult medical examination without abnormal findings: Secondary | ICD-10-CM | POA: Diagnosis not present

## 2017-04-03 DIAGNOSIS — J209 Acute bronchitis, unspecified: Secondary | ICD-10-CM | POA: Diagnosis not present

## 2017-04-10 DIAGNOSIS — J019 Acute sinusitis, unspecified: Secondary | ICD-10-CM | POA: Diagnosis not present

## 2017-04-10 DIAGNOSIS — R05 Cough: Secondary | ICD-10-CM | POA: Diagnosis not present

## 2017-04-10 DIAGNOSIS — R509 Fever, unspecified: Secondary | ICD-10-CM | POA: Diagnosis not present

## 2017-04-23 DIAGNOSIS — M3500 Sicca syndrome, unspecified: Secondary | ICD-10-CM | POA: Diagnosis not present

## 2017-04-23 DIAGNOSIS — R21 Rash and other nonspecific skin eruption: Secondary | ICD-10-CM | POA: Diagnosis not present

## 2017-04-23 DIAGNOSIS — M5440 Lumbago with sciatica, unspecified side: Secondary | ICD-10-CM | POA: Diagnosis not present

## 2017-04-23 DIAGNOSIS — D696 Thrombocytopenia, unspecified: Secondary | ICD-10-CM | POA: Diagnosis not present

## 2017-04-25 DIAGNOSIS — R0602 Shortness of breath: Secondary | ICD-10-CM | POA: Diagnosis not present

## 2017-04-25 DIAGNOSIS — R0789 Other chest pain: Secondary | ICD-10-CM | POA: Diagnosis not present

## 2017-04-25 DIAGNOSIS — R51 Headache: Secondary | ICD-10-CM | POA: Diagnosis not present

## 2017-05-11 DIAGNOSIS — R0602 Shortness of breath: Secondary | ICD-10-CM | POA: Diagnosis not present

## 2017-06-20 DIAGNOSIS — R921 Mammographic calcification found on diagnostic imaging of breast: Secondary | ICD-10-CM | POA: Diagnosis not present

## 2017-06-20 DIAGNOSIS — R922 Inconclusive mammogram: Secondary | ICD-10-CM | POA: Diagnosis not present

## 2017-07-09 DIAGNOSIS — J069 Acute upper respiratory infection, unspecified: Secondary | ICD-10-CM | POA: Diagnosis not present

## 2017-07-09 DIAGNOSIS — R509 Fever, unspecified: Secondary | ICD-10-CM | POA: Diagnosis not present

## 2017-07-09 DIAGNOSIS — M069 Rheumatoid arthritis, unspecified: Secondary | ICD-10-CM | POA: Diagnosis not present

## 2017-07-09 DIAGNOSIS — M3500 Sicca syndrome, unspecified: Secondary | ICD-10-CM | POA: Diagnosis not present

## 2017-07-11 DIAGNOSIS — M5440 Lumbago with sciatica, unspecified side: Secondary | ICD-10-CM | POA: Diagnosis not present

## 2017-07-11 DIAGNOSIS — D696 Thrombocytopenia, unspecified: Secondary | ICD-10-CM | POA: Diagnosis not present

## 2017-07-11 DIAGNOSIS — R21 Rash and other nonspecific skin eruption: Secondary | ICD-10-CM | POA: Diagnosis not present

## 2017-07-11 DIAGNOSIS — M3500 Sicca syndrome, unspecified: Secondary | ICD-10-CM | POA: Diagnosis not present

## 2017-07-17 DIAGNOSIS — R21 Rash and other nonspecific skin eruption: Secondary | ICD-10-CM | POA: Diagnosis not present

## 2017-07-17 DIAGNOSIS — M5440 Lumbago with sciatica, unspecified side: Secondary | ICD-10-CM | POA: Diagnosis not present

## 2017-07-17 DIAGNOSIS — Z6820 Body mass index (BMI) 20.0-20.9, adult: Secondary | ICD-10-CM | POA: Diagnosis not present

## 2017-07-17 DIAGNOSIS — M3500 Sicca syndrome, unspecified: Secondary | ICD-10-CM | POA: Diagnosis not present

## 2017-07-17 DIAGNOSIS — R509 Fever, unspecified: Secondary | ICD-10-CM | POA: Diagnosis not present

## 2017-07-27 ENCOUNTER — Telehealth: Payer: Self-pay | Admitting: *Deleted

## 2017-07-27 NOTE — Telephone Encounter (Signed)
Received labs from Dr. Shawnee Knapp office. CBC looks good, per Dr. Truett Perna. No need for hematology appt. Called pt, she reports rheumatologist was concerned because she developed fevers and bruising on her feet. "It looked like broken blood vessels." Both have resolved. Pt thinks it was an "autoimmune thing". She is glad to hear her lab looked OK. Pt stated she would want to see Dr. Truett Perna if she has issues with ITP in the future. Provided office contact number.

## 2017-09-06 ENCOUNTER — Telehealth: Payer: Self-pay | Admitting: Oncology

## 2017-09-06 NOTE — Telephone Encounter (Signed)
Spoke with patient and she stated that Shannan Harper had called and stated that Dr Truett Perna did not need to see her at this time based on her labs that he reviewed.

## 2017-10-16 DIAGNOSIS — D2261 Melanocytic nevi of right upper limb, including shoulder: Secondary | ICD-10-CM | POA: Diagnosis not present

## 2017-10-16 DIAGNOSIS — D2262 Melanocytic nevi of left upper limb, including shoulder: Secondary | ICD-10-CM | POA: Diagnosis not present

## 2017-10-16 DIAGNOSIS — Z85828 Personal history of other malignant neoplasm of skin: Secondary | ICD-10-CM | POA: Diagnosis not present

## 2017-10-16 DIAGNOSIS — D225 Melanocytic nevi of trunk: Secondary | ICD-10-CM | POA: Diagnosis not present

## 2017-12-05 DIAGNOSIS — R300 Dysuria: Secondary | ICD-10-CM | POA: Diagnosis not present

## 2017-12-05 DIAGNOSIS — B349 Viral infection, unspecified: Secondary | ICD-10-CM | POA: Diagnosis not present

## 2017-12-26 DIAGNOSIS — J069 Acute upper respiratory infection, unspecified: Secondary | ICD-10-CM | POA: Diagnosis not present

## 2017-12-26 DIAGNOSIS — R509 Fever, unspecified: Secondary | ICD-10-CM | POA: Diagnosis not present

## 2018-01-31 DIAGNOSIS — Z79899 Other long term (current) drug therapy: Secondary | ICD-10-CM | POA: Diagnosis not present

## 2018-01-31 DIAGNOSIS — H16123 Filamentary keratitis, bilateral: Secondary | ICD-10-CM | POA: Diagnosis not present

## 2018-01-31 DIAGNOSIS — H04123 Dry eye syndrome of bilateral lacrimal glands: Secondary | ICD-10-CM | POA: Diagnosis not present

## 2018-01-31 DIAGNOSIS — M3500 Sicca syndrome, unspecified: Secondary | ICD-10-CM | POA: Diagnosis not present

## 2018-03-06 DIAGNOSIS — R61 Generalized hyperhidrosis: Secondary | ICD-10-CM | POA: Diagnosis not present

## 2018-03-06 DIAGNOSIS — Z0000 Encounter for general adult medical examination without abnormal findings: Secondary | ICD-10-CM | POA: Diagnosis not present

## 2018-03-06 DIAGNOSIS — Z13220 Encounter for screening for lipoid disorders: Secondary | ICD-10-CM | POA: Diagnosis not present

## 2018-04-03 DIAGNOSIS — M3500 Sicca syndrome, unspecified: Secondary | ICD-10-CM | POA: Diagnosis not present

## 2018-04-03 DIAGNOSIS — M5440 Lumbago with sciatica, unspecified side: Secondary | ICD-10-CM | POA: Diagnosis not present

## 2018-04-03 DIAGNOSIS — D696 Thrombocytopenia, unspecified: Secondary | ICD-10-CM | POA: Diagnosis not present

## 2018-04-03 DIAGNOSIS — R21 Rash and other nonspecific skin eruption: Secondary | ICD-10-CM | POA: Diagnosis not present

## 2018-08-06 DIAGNOSIS — H04123 Dry eye syndrome of bilateral lacrimal glands: Secondary | ICD-10-CM | POA: Diagnosis not present

## 2018-08-06 DIAGNOSIS — Z79899 Other long term (current) drug therapy: Secondary | ICD-10-CM | POA: Diagnosis not present

## 2018-08-06 DIAGNOSIS — H16123 Filamentary keratitis, bilateral: Secondary | ICD-10-CM | POA: Diagnosis not present

## 2018-08-06 DIAGNOSIS — M3500 Sicca syndrome, unspecified: Secondary | ICD-10-CM | POA: Diagnosis not present

## 2018-10-04 DIAGNOSIS — M5440 Lumbago with sciatica, unspecified side: Secondary | ICD-10-CM | POA: Diagnosis not present

## 2018-10-04 DIAGNOSIS — M3500 Sicca syndrome, unspecified: Secondary | ICD-10-CM | POA: Diagnosis not present

## 2018-10-04 DIAGNOSIS — R21 Rash and other nonspecific skin eruption: Secondary | ICD-10-CM | POA: Diagnosis not present

## 2018-10-16 DIAGNOSIS — D2262 Melanocytic nevi of left upper limb, including shoulder: Secondary | ICD-10-CM | POA: Diagnosis not present

## 2018-10-16 DIAGNOSIS — Z85820 Personal history of malignant melanoma of skin: Secondary | ICD-10-CM | POA: Diagnosis not present

## 2018-10-16 DIAGNOSIS — Z85828 Personal history of other malignant neoplasm of skin: Secondary | ICD-10-CM | POA: Diagnosis not present

## 2018-10-16 DIAGNOSIS — D2261 Melanocytic nevi of right upper limb, including shoulder: Secondary | ICD-10-CM | POA: Diagnosis not present

## 2018-10-25 DIAGNOSIS — Z1231 Encounter for screening mammogram for malignant neoplasm of breast: Secondary | ICD-10-CM | POA: Diagnosis not present

## 2018-11-21 DIAGNOSIS — R928 Other abnormal and inconclusive findings on diagnostic imaging of breast: Secondary | ICD-10-CM | POA: Diagnosis not present

## 2019-03-10 DIAGNOSIS — Z0000 Encounter for general adult medical examination without abnormal findings: Secondary | ICD-10-CM | POA: Diagnosis not present

## 2019-03-10 DIAGNOSIS — N926 Irregular menstruation, unspecified: Secondary | ICD-10-CM | POA: Diagnosis not present

## 2019-03-10 DIAGNOSIS — E559 Vitamin D deficiency, unspecified: Secondary | ICD-10-CM | POA: Diagnosis not present

## 2019-04-04 DIAGNOSIS — M3500 Sicca syndrome, unspecified: Secondary | ICD-10-CM | POA: Diagnosis not present

## 2019-04-04 DIAGNOSIS — R21 Rash and other nonspecific skin eruption: Secondary | ICD-10-CM | POA: Diagnosis not present

## 2019-04-04 DIAGNOSIS — M5440 Lumbago with sciatica, unspecified side: Secondary | ICD-10-CM | POA: Diagnosis not present

## 2019-06-13 DIAGNOSIS — E559 Vitamin D deficiency, unspecified: Secondary | ICD-10-CM | POA: Diagnosis not present

## 2019-06-13 DIAGNOSIS — N912 Amenorrhea, unspecified: Secondary | ICD-10-CM | POA: Diagnosis not present

## 2019-06-13 DIAGNOSIS — E162 Hypoglycemia, unspecified: Secondary | ICD-10-CM | POA: Diagnosis not present

## 2019-06-23 ENCOUNTER — Other Ambulatory Visit: Payer: Self-pay | Admitting: Family Medicine

## 2019-06-23 ENCOUNTER — Ambulatory Visit: Admission: RE | Admit: 2019-06-23 | Payer: BLUE CROSS/BLUE SHIELD | Source: Ambulatory Visit

## 2019-06-23 DIAGNOSIS — R109 Unspecified abdominal pain: Secondary | ICD-10-CM

## 2019-06-23 DIAGNOSIS — R3915 Urgency of urination: Secondary | ICD-10-CM | POA: Diagnosis not present

## 2019-06-23 DIAGNOSIS — Z23 Encounter for immunization: Secondary | ICD-10-CM | POA: Diagnosis not present

## 2019-06-24 ENCOUNTER — Other Ambulatory Visit: Payer: Self-pay | Admitting: Family Medicine

## 2019-06-25 ENCOUNTER — Other Ambulatory Visit: Payer: BLUE CROSS/BLUE SHIELD

## 2019-07-01 ENCOUNTER — Other Ambulatory Visit: Payer: BLUE CROSS/BLUE SHIELD

## 2019-08-25 DIAGNOSIS — H04123 Dry eye syndrome of bilateral lacrimal glands: Secondary | ICD-10-CM | POA: Diagnosis not present

## 2019-08-25 DIAGNOSIS — Z79899 Other long term (current) drug therapy: Secondary | ICD-10-CM | POA: Diagnosis not present

## 2019-08-25 DIAGNOSIS — H43813 Vitreous degeneration, bilateral: Secondary | ICD-10-CM | POA: Diagnosis not present

## 2019-08-25 DIAGNOSIS — H16123 Filamentary keratitis, bilateral: Secondary | ICD-10-CM | POA: Diagnosis not present

## 2019-08-26 DIAGNOSIS — R05 Cough: Secondary | ICD-10-CM | POA: Diagnosis not present

## 2019-08-26 DIAGNOSIS — R519 Headache, unspecified: Secondary | ICD-10-CM | POA: Diagnosis not present

## 2019-08-26 DIAGNOSIS — Z20828 Contact with and (suspected) exposure to other viral communicable diseases: Secondary | ICD-10-CM | POA: Diagnosis not present

## 2019-10-10 ENCOUNTER — Other Ambulatory Visit: Payer: Self-pay

## 2019-10-10 DIAGNOSIS — Z20822 Contact with and (suspected) exposure to covid-19: Secondary | ICD-10-CM | POA: Diagnosis not present

## 2019-10-12 LAB — NOVEL CORONAVIRUS, NAA: SARS-CoV-2, NAA: NOT DETECTED

## 2019-10-22 DIAGNOSIS — D225 Melanocytic nevi of trunk: Secondary | ICD-10-CM | POA: Diagnosis not present

## 2019-10-22 DIAGNOSIS — D2261 Melanocytic nevi of right upper limb, including shoulder: Secondary | ICD-10-CM | POA: Diagnosis not present

## 2019-10-22 DIAGNOSIS — Z85828 Personal history of other malignant neoplasm of skin: Secondary | ICD-10-CM | POA: Diagnosis not present

## 2019-10-22 DIAGNOSIS — D2262 Melanocytic nevi of left upper limb, including shoulder: Secondary | ICD-10-CM | POA: Diagnosis not present

## 2019-10-29 ENCOUNTER — Ambulatory Visit: Payer: BLUE CROSS/BLUE SHIELD | Attending: Internal Medicine

## 2019-10-29 DIAGNOSIS — Z20822 Contact with and (suspected) exposure to covid-19: Secondary | ICD-10-CM | POA: Diagnosis not present

## 2019-10-30 ENCOUNTER — Encounter: Payer: Self-pay | Admitting: *Deleted

## 2019-10-30 LAB — NOVEL CORONAVIRUS, NAA: SARS-CoV-2, NAA: DETECTED — AB

## 2019-10-31 ENCOUNTER — Telehealth: Payer: Self-pay | Admitting: Nurse Practitioner

## 2019-10-31 NOTE — Telephone Encounter (Signed)
Called to discuss with [name] about Covid symptoms and the use of bamlanivimab, a monoclonal antibody infusion for those with mild to moderate Covid symptoms and at a high risk of hospitalization.     Pt is qualified for this infusion at the [facility] infusion center due to co-morbid conditions and/or a member of an at-risk group, however declines infusion at this time.  Symptoms tier reviewed as well as criteria for ending isolation.  Symptoms reviewed that would warrant ED/Hospital evaluation. Preventative practices reviewed. Patient verbalized understanding. Patient advised to call back if he decides that he does want to get infusion. Callback number to the infusion center given via MyChart message as requested. Patient advised to go to Urgent care or ED with severe symptoms. Last date she would be eligible for infusion is 11/04/19.   Patient Active Problem List   Diagnosis Date Noted  . Low back pain 02/15/2016    Willette Alma, AGPCNP-BC Pager: [phone] Amion: N. Cousar

## 2019-11-01 ENCOUNTER — Other Ambulatory Visit: Payer: Self-pay | Admitting: Nurse Practitioner

## 2019-11-01 ENCOUNTER — Telehealth: Payer: Self-pay | Admitting: Nurse Practitioner

## 2019-11-01 DIAGNOSIS — M069 Rheumatoid arthritis, unspecified: Secondary | ICD-10-CM

## 2019-11-01 DIAGNOSIS — D693 Immune thrombocytopenic purpura: Secondary | ICD-10-CM

## 2019-11-01 DIAGNOSIS — U071 COVID-19: Secondary | ICD-10-CM

## 2019-11-01 NOTE — Progress Notes (Signed)
  I connected by phone with Sarah Garza on 11/01/2019 at 10:50 AM to discuss the potential use of an new treatment for mild to moderate COVID-19 viral infection in non-hospitalized patients.  This patient is a 49 y.o. female that meets the FDA criteria for Emergency Use Authorization of bamlanivimab or casirivimab\imdevimab.  Has a (+) direct SARS-CoV-2 viral test result  Has mild or moderate COVID-19   Is ? 50 years of age and weighs ? 40 kg  Is NOT hospitalized due to COVID-19  Is NOT requiring oxygen therapy or requiring an increase in baseline oxygen flow rate due to COVID-19  Is within 10 days of symptom onset  Has at least one of the high risk factor(s) for progression to severe COVID-19 and/or hospitalization as defined in EUA.  Specific high risk criteria : Immunosuppressive Disease   I have spoken and communicated the following to the patient or parent/caregiver:  1. FDA has authorized the emergency use of bamlanivimab and casirivimab\imdevimab for the treatment of mild to moderate COVID-19 in adults and pediatric patients with positive results of direct SARS-CoV-2 viral testing who are 34 years of age and older weighing at least 40 kg, and who are at high risk for progressing to severe COVID-19 and/or hospitalization.  2. The significant known and potential risks and benefits of bamlanivimab and casirivimab\imdevimab, and the extent to which such potential risks and benefits are unknown.  3. Information on available alternative treatments and the risks and benefits of those alternatives, including clinical trials.  4. Patients treated with bamlanivimab and casirivimab\imdevimab should continue to self-isolate and use infection control measures (e.g., wear mask, isolate, social distance, avoid sharing personal items, clean and disinfect "high touch" surfaces, and frequent handwashing) according to CDC guidelines.   5. The patient or parent/caregiver has the option to  accept or refuse bamlanivimab or casirivimab\imdevimab .  After reviewing this information with the patient, The patient agreed to proceed with receiving the bamlanimivab infusion and will be provided a copy of the Fact sheet prior to receiving the infusion.   Nikki Pickenpack-Cousar 11/01/2019 10:50 AM

## 2019-11-01 NOTE — Telephone Encounter (Signed)
Called to discuss with Sarah Garza about Covid symptoms and the use of bamlanivimab, a monoclonal antibody infusion for those with mild to moderate Covid symptoms and at a high risk of hospitalization.     Pt is qualified for this infusion at the [facility] infusion center due to co-morbid conditions and/or a member of an at-risk group. Patient is requesting infusion appointment. She has been scheduled for 11/03/19 @ 1230 as requested.   Sarah Garza verbalized understanding of treatment and appointment details. MyChart confirmation sent as requested.    Patient Active Problem List   Diagnosis Date Noted  . Low back pain 02/15/2016    Sarah Garza, AGPCNP-BC Pager: [phone] Amion: N. Cousar

## 2019-11-03 ENCOUNTER — Ambulatory Visit (HOSPITAL_COMMUNITY)
Admission: RE | Admit: 2019-11-03 | Discharge: 2019-11-03 | Disposition: A | Discharge status: Home / Self Care | Payer: BC Managed Care – PPO | Source: Ambulatory Visit | Attending: Pulmonary Disease | Admitting: Pulmonary Disease

## 2019-11-03 DIAGNOSIS — M069 Rheumatoid arthritis, unspecified: Secondary | ICD-10-CM | POA: Diagnosis not present

## 2019-11-03 DIAGNOSIS — U071 COVID-19: Secondary | ICD-10-CM

## 2019-11-03 DIAGNOSIS — D693 Immune thrombocytopenic purpura: Secondary | ICD-10-CM | POA: Diagnosis not present

## 2019-11-03 DIAGNOSIS — J111 Influenza due to unidentified influenza virus with other respiratory manifestations: Secondary | ICD-10-CM | POA: Diagnosis not present

## 2019-11-03 MED ORDER — DIPHENHYDRAMINE HCL 50 MG/ML IJ SOLN: 50.0000 mg | Freq: Once | INTRAMUSCULAR | Status: DC | PRN

## 2019-11-03 MED ORDER — FAMOTIDINE IN NACL 20-0.9 MG/50ML-% IV SOLN: 20.0000 mg | Freq: Once | INTRAVENOUS | Status: DC | PRN

## 2019-11-03 MED ORDER — ALBUTEROL SULFATE HFA 108 (90 BASE) MCG/ACT IN AERS: 2.0000 | INHALATION_SPRAY | Freq: Once | RESPIRATORY_TRACT | Status: DC | PRN

## 2019-11-03 MED ORDER — SODIUM CHLORIDE 0.9 % IV SOLN
700.0000 mg | Freq: Once | INTRAVENOUS | Status: AC
  Administered 2019-11-03: 700 mg via INTRAVENOUS
  Filled 2019-11-03: qty 20

## 2019-11-03 MED ORDER — EPINEPHRINE 0.3 MG/0.3ML IJ SOAJ: 0.3000 mg | Freq: Once | INTRAMUSCULAR | Status: DC | PRN

## 2019-11-03 MED ORDER — SODIUM CHLORIDE 0.9 % IV SOLN: INTRAVENOUS | Status: DC | PRN

## 2019-11-03 MED ORDER — METHYLPREDNISOLONE SODIUM SUCC 125 MG IJ SOLR: 125.0000 mg | Freq: Once | INTRAMUSCULAR | Status: DC | PRN

## 2019-11-03 NOTE — Discharge Instructions (Signed)

## 2019-11-03 NOTE — Progress Notes (Signed)
Patient ID: Sarah Garza, female   DOB: January 10, 1971, 49 y.o.   MRN: [id number]  Diagnosis: COVID-19  Physician: Delford Field  Procedure: [facility] Infusion [facility] Med: bamlanivimab infusion - Provided patient with bamlanimivab fact sheet for patients, parents and caregivers prior to infusion.  Complications: No immediate complications noted.  Discharge: Discharged home   Shaune Spittle 11/03/2019

## 2019-11-04 ENCOUNTER — Telehealth: Payer: Self-pay | Admitting: Nurse Practitioner

## 2019-11-04 NOTE — Telephone Encounter (Signed)
Called patient to see how she was feeling after receiving MyChart message that she was not feeling well post-infusion. Patient reports she is now feeling better and no longer experiencing chest pain or "feelings of lump in her throat". She has been able to eat and drink. She denies worsening symptoms.   Advised patient if symptoms worsen or she has difficulty breathing to seek emergency assistance. She verbalized understanding.

## 2019-11-11 DIAGNOSIS — Z1231 Encounter for screening mammogram for malignant neoplasm of breast: Secondary | ICD-10-CM | POA: Diagnosis not present

## 2019-11-18 DIAGNOSIS — R922 Inconclusive mammogram: Secondary | ICD-10-CM | POA: Diagnosis not present

## 2019-12-31 DIAGNOSIS — M3500 Sicca syndrome, unspecified: Secondary | ICD-10-CM | POA: Diagnosis not present

## 2019-12-31 DIAGNOSIS — M5440 Lumbago with sciatica, unspecified side: Secondary | ICD-10-CM | POA: Diagnosis not present

## 2019-12-31 DIAGNOSIS — R21 Rash and other nonspecific skin eruption: Secondary | ICD-10-CM | POA: Diagnosis not present

## 2019-12-31 DIAGNOSIS — E559 Vitamin D deficiency, unspecified: Secondary | ICD-10-CM | POA: Diagnosis not present

## 2019-12-31 DIAGNOSIS — D696 Thrombocytopenia, unspecified: Secondary | ICD-10-CM | POA: Diagnosis not present

## 2020-03-15 ENCOUNTER — Other Ambulatory Visit: Payer: Self-pay | Admitting: Family Medicine

## 2020-03-15 ENCOUNTER — Other Ambulatory Visit (HOSPITAL_COMMUNITY)
Admission: RE | Admit: 2020-03-15 | Discharge: 2020-03-15 | Disposition: A | Discharge status: Home / Self Care | Payer: BC Managed Care – PPO | Source: Ambulatory Visit | Attending: Family Medicine | Admitting: Family Medicine

## 2020-03-15 DIAGNOSIS — Z131 Encounter for screening for diabetes mellitus: Secondary | ICD-10-CM | POA: Diagnosis not present

## 2020-03-15 DIAGNOSIS — Z0000 Encounter for general adult medical examination without abnormal findings: Secondary | ICD-10-CM | POA: Diagnosis not present

## 2020-03-15 DIAGNOSIS — Z13220 Encounter for screening for lipoid disorders: Secondary | ICD-10-CM | POA: Diagnosis not present

## 2020-03-15 DIAGNOSIS — N951 Menopausal and female climacteric states: Secondary | ICD-10-CM | POA: Diagnosis not present

## 2020-03-15 DIAGNOSIS — Z124 Encounter for screening for malignant neoplasm of cervix: Secondary | ICD-10-CM | POA: Insufficient documentation

## 2020-03-15 DIAGNOSIS — E559 Vitamin D deficiency, unspecified: Secondary | ICD-10-CM | POA: Diagnosis not present

## 2020-03-18 LAB — CYTOLOGY - PAP
Comment: NEGATIVE
Diagnosis: NEGATIVE
High risk HPV: NEGATIVE

## 2020-04-13 DIAGNOSIS — R002 Palpitations: Secondary | ICD-10-CM | POA: Diagnosis not present

## 2020-04-16 ENCOUNTER — Ambulatory Visit: Payer: BC Managed Care – PPO | Admitting: Cardiology

## 2020-04-16 ENCOUNTER — Other Ambulatory Visit: Payer: Self-pay

## 2020-04-16 ENCOUNTER — Encounter: Payer: Self-pay | Admitting: Cardiology

## 2020-04-16 DIAGNOSIS — R002 Palpitations: Secondary | ICD-10-CM | POA: Diagnosis not present

## 2020-04-16 DIAGNOSIS — R011 Cardiac murmur, unspecified: Secondary | ICD-10-CM | POA: Diagnosis not present

## 2020-04-16 NOTE — Patient Instructions (Signed)
Medication Instructions:  Your physician recommends that you continue on your current medications as directed. Please refer to the Current Medication list given to you today.  *If you need a refill on your cardiac medications before your next appointment, please call your pharmacy*   Lab Work: None ordered If you have labs (blood work) drawn today and your tests are completely normal, you will receive your results only by: Marland Kitchen MyChart Message (if you have MyChart) OR . A paper copy in the mail If you have any lab test that is abnormal or we need to change your treatment, we will call you to review the results.   Testing/Procedures: Your physician has requested that you have an echocardiogram. Echocardiography is a painless test that uses sound waves to create images of your heart. It provides your doctor with information about the size and shape of your heart and how well your heart's chambers and valves are working. This procedure takes approximately one hour. There are no restrictions for this procedure.   ZIO XT- Long Term Monitor Instructions   Your physician has requested you wear your ZIO patch monitor 14 days.   This is a single patch monitor.  Irhythm supplies one patch monitor per enrollment.  Additional stickers are not available.   Please do not apply patch if you will be having a Nuclear Stress Test, Echocardiogram, Cardiac CT, MRI, or Chest Xray during the time frame you would be wearing the monitor. The patch cannot be worn during these tests.  You cannot remove and re-apply the ZIO XT patch monitor.    Once you have received you monitor, please review enclosed instructions.  Your monitor has already been registered assigning a specific monitor serial # to you.   Applying the monitor   Shave hair from upper left chest.   Hold abrader disc by orange tab.  Rub abrader in 40 strokes over left upper chest as indicated in your monitor instructions.   Clean area with 4  enclosed alcohol pads .  Use all pads to assure are is cleaned thoroughly.  Let dry.   Apply patch as indicated in monitor instructions.  Patch will be place under collarbone on left side of chest with arrow pointing upward.   Rub patch adhesive wings for 2 minutes.Remove white label marked "1".  Remove white label marked "2".  Rub patch adhesive wings for 2 additional minutes.   While looking in a mirror, press and release button in center of patch.  A small green light will flash 3-4 times .  This will be your only indicator the monitor has been turned on.     Do not shower for the first 24 hours.  You may shower after the first 24 hours.   Press button if you feel a symptom. You will hear a small click.  Record Date, Time and Symptom in the Patient Log Book.   When you are ready to remove patch, follow instructions on last 2 pages of Patient Log Book.  Stick patch monitor onto last page of Patient Log Book.   Place Patient Log Book in [place] box.  Use locking tab on box and tape box closed securely.  The [employer] and [employer] has [employer] on it.  Please place in mailbox as soon as possible.  Your physician should have your test results approximately 7 days after the monitor has been mailed back to [facility].   Call [facility] Customer Care at [phone] if you have questions  regarding your ZIO XT patch monitor.  Call them immediately if you see an orange light blinking on your monitor.   If your monitor falls off in less than 4 days contact our Monitor department at [phone].  If your monitor becomes loose or falls off after 4 days call [employer] at [phone] for suggestions on securing your monitor.     Follow-Up: At [facility], you and your health needs are our priority.  As part of our continuing mission to provide you with exceptional heart care, we have created designated Provider Care Teams.  These Care Teams include your primary Cardiologist (physician) and  Advanced Practice Providers (APPs -  Physician Assistants and Nurse Practitioners) who all work together to provide you with the care you need, when you need it.  We recommend signing up for the patient portal called "MyChart".  Sign up information is provided on this After Visit Summary.  MyChart is used to connect with patients for Virtual Visits (Telemedicine).  Patients are able to view lab/test results, encounter notes, upcoming appointments, etc.  Non-urgent messages can be sent to your provider as well.   To learn more about what you can do with MyChart, go to [email or web address].    Your next appointment:   3 month(s)  The format for your next appointment:   In Person  Provider:   Belva Crome, MD   Other Instructions  Echocardiogram An echocardiogram is a procedure that uses painless sound waves (ultrasound) to produce an image of the heart. Images from an echocardiogram can provide important information about:  Signs of coronary artery disease (CAD).  Aneurysm detection. An aneurysm is a weak or damaged part of an artery wall that bulges out from the normal force of blood pumping through the body.  Heart size and shape. Changes in the size or shape of the heart can be associated with certain conditions, including heart failure, aneurysm, and CAD.  Heart muscle function.  Heart valve function.  Signs of a past heart attack.  Fluid buildup around the heart.  Thickening of the heart muscle.  A tumor or infectious growth around the heart valves. Tell a health care provider about:  Any allergies you have.  All medicines you are taking, including vitamins, herbs, eye drops, creams, and over-the-counter medicines.  Any blood disorders you have.  Any surgeries you have had.  Any medical conditions you have.  Whether you are pregnant or may be pregnant. What are the risks? Generally, this is a safe procedure. However, problems may occur,  including:  Allergic reaction to dye (contrast) that may be used during the procedure. What happens before the procedure? No specific preparation is needed. You may eat and drink normally. What happens during the procedure?   An IV tube may be inserted into one of your veins.  You may receive contrast through this tube. A contrast is an injection that improves the quality of the pictures from your heart.  A gel will be applied to your chest.  A wand-like tool (transducer) will be moved over your chest. The gel will help to transmit the sound waves from the transducer.  The sound waves will harmlessly bounce off of your heart to allow the heart images to be captured in real-time motion. The images will be recorded on a computer. The procedure may vary among health care providers and hospitals. What happens after the procedure?  You may return to your normal, everyday life, including diet, activities, and medicines, unless your health  care provider tells you not to do that. Summary  An echocardiogram is a procedure that uses painless sound waves (ultrasound) to produce an image of the heart.  Images from an echocardiogram can provide important information about the size and shape of your heart, heart muscle function, heart valve function, and fluid buildup around your heart.  You do not need to do anything to prepare before this procedure. You may eat and drink normally.  After the echocardiogram is completed, you may return to your normal, everyday life, unless your health care provider tells you not to do that. This information is not intended to replace advice given to you by your health care provider. Make sure you discuss any questions you have with your health care provider. Document Revised: 12/26/2018 Document Reviewed: 10/07/2016 Elsevier Patient Education  2020 [employer].

## 2020-04-16 NOTE — Progress Notes (Signed)
Cardiology Office Note:    Date:  04/16/2020   ID:  Sarah Garza, DOB 18-Jan-1971, MRN [id number]  PCP:  Sigmund Hazel, MD  Cardiologist:  Garwin Brothers, MD   Referring MD: Sigmund Hazel, MD     ASSESSMENT:    1. Palpitations   2. Cardiac murmur    PLAN:    In order of problems listed above:  1. Primary prevention stressed with the patient.  Importance of compliance with diet medication stressed and she vocalized understanding. 2. Low blood pressure: I discussed my findings with the patient at extensive length.  Keep her self well-hydrated and increase salt intake in the diet to some extent.  Also compression stockings and other measures were recommended for blood pressure issues and she promises to follow them. 3. In view of palpitations she will have a 2-week monitoring.  I also told her about the cardia device for self-monitoring and she is thinking about it. 4. Cardiac murmur: Echocardiogram will be done to assess murmur heard on auscultation. 5. Patient will be seen in follow-up appointment in 3 months or earlier if the patient has any concerns    Medication Adjustments/Labs and Tests Ordered: Current medicines are reviewed at length with the patient today.  Concerns regarding medicines are outlined above.  No orders of the defined types were placed in this encounter.  No orders of the defined types were placed in this encounter.    History of Present Illness:    Sarah Garza is a 49 y.o. female who is being seen today for the evaluation of palpitations at the request of Sigmund Hazel, MD.  Patient is a pleasant 49 year old female.  She has history of Sjogren's syndrome.  She has rheumatoid arthritis.  She mentions to me that she has occasional palpitations-like symptoms she has never passed out or felt dizzy.  Her blood pressure is low and she has had this issue for a long time.  No orthopnea or PND.  She has been evaluated by Dr. Antony Madura our colleague cardiologist  in the past but not much in terms of findings.  She is overall healthy lady and exercises on a regular basis.  No chest pain orthopnea or PND.  At the time of my evaluation, the patient is alert awake oriented and in no distress.  Past Medical History:  Diagnosis Date  . ITP (idiopathic thrombocytopenic purpura)   . Low back pain 02/15/2016  . Rheumatoid aortitis   . Sjoegren syndrome   . Syncope     Past Surgical History:  Procedure Laterality Date  . FOOT SURGERY    . HERNIA REPAIR      Current Medications: Current Meds  Medication Sig  . hydroxychloroquine (PLAQUENIL) 200 MG tablet Take 200 mg by mouth daily.  . Vitamin D, Ergocalciferol, (DRISDOL) 1.25 MG (50000 UNIT) CAPS capsule Take 50,000 Units by mouth once a week.     Allergies:   Levaquin [levofloxacin] and Penicillins   Social History   Socioeconomic History  . Marital status: Single    Spouse name: Not on file  . Number of children: 0  . Years of education: 58  . Highest education level: Not on file  Occupational History  . Not on file  Tobacco Use  . Smoking status: Never Smoker  . Smokeless tobacco: Never Used  Substance and Sexual Activity  . Alcohol use: No  . Drug use: No  . Sexual activity: Not on file  Other Topics Concern  . Not  on file  Social History Narrative   Lives at home w/ her mother   Right-handed   Drinks 1 cup of coffee daily   Social Determinants of Health   Financial Resource Strain:   . Difficulty of Paying Living Expenses:   Food Insecurity:   . Worried About Programme researcher, broadcasting/film/video in the Last Year:   . Barista in the Last Year:   Transportation Needs:   . Freight forwarder (Medical):   Marland Kitchen Lack of Transportation (Non-Medical):   Physical Activity:   . Days of Exercise per Week:   . Minutes of Exercise per Session:   Stress:   . Feeling of Stress :   Social Connections:   . Frequency of Communication with Friends and Family:   . Frequency of Social  Gatherings with Friends and Family:   . Attends Religious Services:   . Active Member of Clubs or Organizations:   . Attends Banker Meetings:   Marland Kitchen Marital Status:      Family History: The patient's family history includes COPD in her father; Cirrhosis in her father; Hypothyroidism in her mother; Rheum arthritis in her sister; Sjogren's syndrome in her sister.  ROS:   Please see the history of present illness.    All other systems reviewed and are negative.  EKGs/Labs/Other Studies Reviewed:    The following studies were reviewed today: EKG reveals sinus rhythm and nonspecific ST-T changes.   Recent Labs: No results found for requested labs within last 8760 hours.  Recent Lipid Panel No results found for: CHOL, TRIG, HDL, CHOLHDL, VLDL, LDLCALC, LDLDIRECT  Physical Exam:    VS:  BP (!) 88/58   Pulse 70   Ht 5\' 4"  (1.626 m)   Wt 122 lb (55.3 kg)   SpO2 98%   BMI 20.94 kg/m     Wt Readings from Last 3 Encounters:  04/16/20 122 lb (55.3 kg)  02/15/16 124 lb (56.2 kg)  09/10/14 125 lb (56.7 kg)     GEN: Patient is in no acute distress HEENT: Normal NECK: No JVD; No carotid bruits LYMPHATICS: No lymphadenopathy CARDIAC: S1 S2 regular, 2/6 systolic murmur at the apex. RESPIRATORY:  Clear to auscultation without rales, wheezing or rhonchi  ABDOMEN: Soft, non-tender, non-distended MUSCULOSKELETAL:  No edema; No deformity  SKIN: Warm and dry NEUROLOGIC:  Alert and oriented x 3 PSYCHIATRIC:  Normal affect    Signed, 09/12/14, MD  04/16/2020 2:14 PM    [facility]

## 2020-04-21 ENCOUNTER — Ambulatory Visit (HOSPITAL_COMMUNITY): Payer: BC Managed Care – PPO

## 2020-05-20 ENCOUNTER — Telehealth: Payer: Self-pay

## 2020-05-20 NOTE — Telephone Encounter (Signed)
Pt states that she has not applied the monitor as her brother in law passed away and her sister had COVID. Pt states that she has had no additional palpitations since stopping some supplement and does not feel she needs the monitor. Pt advised to call Zio for instructions. Pt verbalized understanding and had no additional questions.

## 2020-06-18 ENCOUNTER — Telehealth: Payer: Self-pay

## 2020-06-18 NOTE — Telephone Encounter (Signed)
Left a message for pt to callback regarding her monitor.

## 2020-06-21 NOTE — Telephone Encounter (Signed)
Patient states she mailed it back weeks ago.

## 2020-06-23 ENCOUNTER — Telehealth: Payer: Self-pay | Admitting: *Deleted

## 2020-06-23 NOTE — Telephone Encounter (Signed)
Please call the patient and check with her as to why she preferred not to wear the monitor.

## 2020-06-23 NOTE — Telephone Encounter (Signed)
Spoke with pt and verified that she did not wear the zio monitor. Called [employer] and spoke with Shanda Bumps who said they processed the monitor and there was no data. Pt will not be charged for the monitor and she does not plan to wear one.

## 2020-06-23 NOTE — Telephone Encounter (Signed)
Earlier note: Pt states that she has not applied the monitor as her brother in law passed away and her sister had COVID. Pt states that she has had no additional palpitations since stopping some supplement and does not feel she needs the monitor.

## 2020-07-01 DIAGNOSIS — M3500 Sicca syndrome, unspecified: Secondary | ICD-10-CM | POA: Diagnosis not present

## 2020-07-01 DIAGNOSIS — R21 Rash and other nonspecific skin eruption: Secondary | ICD-10-CM | POA: Diagnosis not present

## 2020-07-01 DIAGNOSIS — M5440 Lumbago with sciatica, unspecified side: Secondary | ICD-10-CM | POA: Diagnosis not present

## 2020-07-01 DIAGNOSIS — D696 Thrombocytopenia, unspecified: Secondary | ICD-10-CM | POA: Diagnosis not present

## 2020-07-07 ENCOUNTER — Other Ambulatory Visit: Payer: Self-pay | Admitting: Family Medicine

## 2020-07-07 DIAGNOSIS — Z23 Encounter for immunization: Secondary | ICD-10-CM | POA: Diagnosis not present

## 2020-07-07 DIAGNOSIS — N921 Excessive and frequent menstruation with irregular cycle: Secondary | ICD-10-CM | POA: Diagnosis not present

## 2020-07-14 ENCOUNTER — Ambulatory Visit
Admission: RE | Admit: 2020-07-14 | Discharge: 2020-07-14 | Disposition: A | Discharge status: Home / Self Care | Payer: BC Managed Care – PPO | Source: Ambulatory Visit | Attending: Family Medicine | Admitting: Family Medicine

## 2020-07-14 DIAGNOSIS — N921 Excessive and frequent menstruation with irregular cycle: Secondary | ICD-10-CM

## 2020-07-14 DIAGNOSIS — N926 Irregular menstruation, unspecified: Secondary | ICD-10-CM | POA: Diagnosis not present

## 2020-07-14 DIAGNOSIS — D251 Intramural leiomyoma of uterus: Secondary | ICD-10-CM | POA: Diagnosis not present

## 2020-07-21 ENCOUNTER — Ambulatory Visit: Payer: BC Managed Care – PPO | Admitting: Cardiology

## 2020-08-06 DIAGNOSIS — Z01818 Encounter for other preprocedural examination: Secondary | ICD-10-CM | POA: Diagnosis not present

## 2020-08-06 DIAGNOSIS — Z87892 Personal history of anaphylaxis: Secondary | ICD-10-CM | POA: Diagnosis not present

## 2020-08-06 DIAGNOSIS — Z8616 Personal history of COVID-19: Secondary | ICD-10-CM | POA: Diagnosis not present

## 2020-08-06 DIAGNOSIS — K9041 Non-celiac gluten sensitivity: Secondary | ICD-10-CM | POA: Diagnosis not present

## 2020-08-25 DIAGNOSIS — H16123 Filamentary keratitis, bilateral: Secondary | ICD-10-CM | POA: Diagnosis not present

## 2020-08-25 DIAGNOSIS — H2513 Age-related nuclear cataract, bilateral: Secondary | ICD-10-CM | POA: Diagnosis not present

## 2020-08-25 DIAGNOSIS — Z79899 Other long term (current) drug therapy: Secondary | ICD-10-CM | POA: Diagnosis not present

## 2020-08-25 DIAGNOSIS — H04123 Dry eye syndrome of bilateral lacrimal glands: Secondary | ICD-10-CM | POA: Diagnosis not present

## 2020-11-26 DIAGNOSIS — Z1231 Encounter for screening mammogram for malignant neoplasm of breast: Secondary | ICD-10-CM | POA: Diagnosis not present

## 2020-12-30 DIAGNOSIS — M5440 Lumbago with sciatica, unspecified side: Secondary | ICD-10-CM | POA: Diagnosis not present

## 2020-12-30 DIAGNOSIS — M3500 Sicca syndrome, unspecified: Secondary | ICD-10-CM | POA: Diagnosis not present

## 2020-12-30 DIAGNOSIS — R21 Rash and other nonspecific skin eruption: Secondary | ICD-10-CM | POA: Diagnosis not present

## 2020-12-30 DIAGNOSIS — D696 Thrombocytopenia, unspecified: Secondary | ICD-10-CM | POA: Diagnosis not present

## 2021-01-31 DIAGNOSIS — D2272 Melanocytic nevi of left lower limb, including hip: Secondary | ICD-10-CM | POA: Diagnosis not present

## 2021-01-31 DIAGNOSIS — D2271 Melanocytic nevi of right lower limb, including hip: Secondary | ICD-10-CM | POA: Diagnosis not present

## 2021-01-31 DIAGNOSIS — Z85828 Personal history of other malignant neoplasm of skin: Secondary | ICD-10-CM | POA: Diagnosis not present

## 2021-01-31 DIAGNOSIS — D225 Melanocytic nevi of trunk: Secondary | ICD-10-CM | POA: Diagnosis not present

## 2021-03-07 ENCOUNTER — Emergency Department (HOSPITAL_BASED_OUTPATIENT_CLINIC_OR_DEPARTMENT_OTHER): Payer: BC Managed Care – PPO

## 2021-03-07 ENCOUNTER — Other Ambulatory Visit: Payer: Self-pay

## 2021-03-07 ENCOUNTER — Encounter (HOSPITAL_BASED_OUTPATIENT_CLINIC_OR_DEPARTMENT_OTHER): Payer: Self-pay

## 2021-03-07 ENCOUNTER — Emergency Department (HOSPITAL_BASED_OUTPATIENT_CLINIC_OR_DEPARTMENT_OTHER)
Admission: EM | Admit: 2021-03-07 | Discharge: 2021-03-07 | Disposition: A | Discharge status: Home / Self Care | Payer: BC Managed Care – PPO | Attending: Emergency Medicine | Admitting: Emergency Medicine

## 2021-03-07 DIAGNOSIS — Z20822 Contact with and (suspected) exposure to covid-19: Secondary | ICD-10-CM | POA: Diagnosis not present

## 2021-03-07 DIAGNOSIS — R0789 Other chest pain: Secondary | ICD-10-CM | POA: Insufficient documentation

## 2021-03-07 DIAGNOSIS — R6884 Jaw pain: Secondary | ICD-10-CM | POA: Insufficient documentation

## 2021-03-07 DIAGNOSIS — R197 Diarrhea, unspecified: Secondary | ICD-10-CM | POA: Insufficient documentation

## 2021-03-07 DIAGNOSIS — M542 Cervicalgia: Secondary | ICD-10-CM | POA: Insufficient documentation

## 2021-03-07 DIAGNOSIS — R079 Chest pain, unspecified: Secondary | ICD-10-CM

## 2021-03-07 DIAGNOSIS — R110 Nausea: Secondary | ICD-10-CM | POA: Diagnosis not present

## 2021-03-07 LAB — URINALYSIS, ROUTINE W REFLEX MICROSCOPIC
Bilirubin Urine: NEGATIVE
Glucose, UA: NEGATIVE mg/dL
Hgb urine dipstick: NEGATIVE
Ketones, ur: NEGATIVE mg/dL
Leukocytes,Ua: NEGATIVE
Nitrite: NEGATIVE
Protein, ur: NEGATIVE mg/dL
Specific Gravity, Urine: <1.005 — ABNORMAL LOW (ref 1.005–1.030)
pH: 5.5 (ref 5.0–8.0)

## 2021-03-07 LAB — COMPREHENSIVE METABOLIC PANEL
ALT: 27 U/L (ref 0–44)
AST: 27 U/L (ref 15–41)
Albumin: 4 g/dL (ref 3.5–5.0)
Alkaline Phosphatase: 52 U/L (ref 38–126)
Anion gap: 5 (ref 5–15)
BUN: 18 mg/dL (ref 6–20)
CO2: 26 mmol/L (ref 22–32)
Calcium: 9.1 mg/dL (ref 8.9–10.3)
Chloride: 107 mmol/L (ref 98–111)
Creatinine, Ser: 0.9 mg/dL (ref 0.44–1.00)
GFR, Estimated: >60 mL/min (ref >60)
Glucose, Bld: 117 mg/dL — ABNORMAL HIGH (ref 70–99)
Potassium: 4.5 mmol/L (ref 3.5–5.1)
Sodium: 138 mmol/L (ref 135–145)
Total Bilirubin: 0.5 mg/dL (ref 0.3–1.2)
Total Protein: 8.2 g/dL — ABNORMAL HIGH (ref 6.5–8.1)

## 2021-03-07 LAB — LIPASE, BLOOD: Lipase: 42 U/L (ref 11–51)

## 2021-03-07 LAB — PREGNANCY, URINE: Preg Test, Ur: NEGATIVE

## 2021-03-07 LAB — TROPONIN I (HIGH SENSITIVITY)
Troponin I (High Sensitivity): <2 ng/L (ref <18)
Troponin I (High Sensitivity): <2 ng/L (ref <18)

## 2021-03-07 LAB — RESP PANEL BY RT-PCR (FLU A&B, COVID) ARPGX2
Influenza A by PCR: NEGATIVE
Influenza B by PCR: NEGATIVE
SARS Coronavirus 2 by RT PCR: NEGATIVE

## 2021-03-07 NOTE — ED Provider Notes (Signed)
[facility] EMERGENCY DEPARTMENT Provider Note   CSN: [id number] Arrival date & time: 03/07/21  1422     History Chief Complaint  Patient presents with   Abdominal Pain    Sarah Garza is a 50 y.o. female.  HPI     2 weeks ago had flu like illness Saturday began antibiotic, cefdinir  Saturday seemed more congested again, Sunday took probiotic dose, one mucinex, after eating had severe diarrhea, liquid, 8-9 times, felt like going to have syncope Chest tightness, neck and jaw tightness lasted an hour then felt like slept it off, woke up and felt ok until this AM, had some chest pain, had diarrhea, ate breakfast, more diarrhea 9 times today.  Tele health visit said bring stool maybe have cdiff.  Episode of chest pain, feeling of throat couldn't breath, then came here.  Tightness and a stabbing. Nothing makes it better or worse,.  Now doesn't feel tight but feels like effort to breathe. Yesterday a while, today 20 min.  No chest pain with exertion.  Don't feel short of breath but at home during episode did feel dyspnea, somewhat palpitations.  No longer coughing. Stuffy nose yesterday, ear discomfort both sides.  Lightheadedness. Jaw pain.  No vomiting, has had nausea.  No continuing fever    Past Medical History:  Diagnosis Date   ITP (idiopathic thrombocytopenic purpura)    Low back pain 02/15/2016   Rheumatoid aortitis    Sjoegren syndrome    Syncope     Patient Active Problem List   Diagnosis Date Noted   Palpitations 04/16/2020   Cardiac murmur 04/16/2020   Low back pain 02/15/2016    Past Surgical History:  Procedure Laterality Date   FOOT SURGERY     HERNIA REPAIR       OB History   No obstetric history on file.     Family History  Problem Relation Age of Onset   COPD Father    Cirrhosis Father    Sjogren's syndrome Sister    Hypothyroidism Mother    Rheum arthritis Sister     Social History   Tobacco Use   Smoking status: Never    Smokeless tobacco: Never  Substance Use Topics   Alcohol use: No   Drug use: No    Home Medications Prior to Admission medications   Medication Sig Start Date End Date Taking? Authorizing Provider  hydroxychloroquine (PLAQUENIL) 200 MG tablet Take 200 mg by mouth daily.    [provider]  Vitamin D, Ergocalciferol, (DRISDOL) 1.25 MG (50000 UNIT) CAPS capsule Take 50,000 Units by mouth once a week. 03/25/20   [provider]    Allergies    Levaquin [levofloxacin] and Penicillins  Review of Systems   Review of Systems  Constitutional:  Positive for fatigue. Negative for fever.  HENT:  Negative for sore throat.   Eyes:  Negative for visual disturbance.  Respiratory:  Positive for chest tightness and shortness of breath. Negative for cough.   Cardiovascular:  Positive for chest pain.  Gastrointestinal:  Positive for diarrhea and nausea. Negative for abdominal pain and vomiting.  Genitourinary:  Negative for difficulty urinating.  Musculoskeletal:  Positive for neck pain. Negative for back pain.  Skin:  Negative for rash.  Neurological:  Positive for light-headedness. Negative for syncope and headaches.   Physical Exam Updated Vital Signs BP 102/69   Pulse 65   Temp 98.7 F (37.1 C) (Oral)   Resp 13   Ht 5\' 4"  (  1.626 m)   Wt 53.5 kg   LMP 02/08/2021   SpO2 100%   BMI 20.25 kg/m   Physical Exam Vitals and nursing note reviewed.  Constitutional:      General: She is not in acute distress.    Appearance: She is well-developed. She is not diaphoretic.  HENT:     Head: Normocephalic and atraumatic.  Eyes:     Conjunctiva/sclera: Conjunctivae normal.  Cardiovascular:     Rate and Rhythm: Normal rate and regular rhythm.     Heart sounds: Normal heart sounds. No murmur heard.   No friction rub. No gallop.  Pulmonary:     Effort: Pulmonary effort is normal. No respiratory distress.     Breath sounds: Normal breath sounds. No wheezing or rales.   Abdominal:     General: There is no distension.     Palpations: Abdomen is soft.     Tenderness: There is no abdominal tenderness. There is no guarding.  Musculoskeletal:        General: No tenderness.     Cervical back: Normal range of motion.  Skin:    General: Skin is warm and dry.     Findings: No erythema or rash.  Neurological:     Mental Status: She is alert and oriented to person, place, and time.    ED Results / Procedures / Treatments   Labs (all labs ordered are listed, but only abnormal results are displayed) Labs Reviewed  COMPREHENSIVE METABOLIC PANEL - Abnormal; Notable for the following components:      Result Value   Glucose, Bld 117 (*)    Total Protein 8.2 (*)    All other components within normal limits  CBC - Abnormal; Notable for the following components:   Platelets 97 (*)    All other components within normal limits  URINALYSIS, ROUTINE W REFLEX MICROSCOPIC - Abnormal; Notable for the following components:   Specific Gravity, Urine <1.005 (*)    All other components within normal limits  RESP PANEL BY RT-PCR (FLU A&B, COVID) [id number]  GASTROINTESTINAL PANEL BY PCR, STOOL (REPLACES STOOL CULTURE)  LIPASE, BLOOD  PREGNANCY, URINE  TROPONIN I (HIGH SENSITIVITY)  TROPONIN I (HIGH SENSITIVITY)    EKG EKG Interpretation  Date/Time:  Monday March 07 2021 16:09:48 EDT Ventricular Rate:  66 PR Interval:  188 QRS Duration: 84 QT Interval:  386 QTC Calculation: 405 R Axis:   85 Text Interpretation: Sinus rhythm Low voltage, precordial leads ST elev, probable normal early repol pattern New TW changes aVL Confirmed by Alvira Monday ([id number]) on 03/07/2021 4:44:30 PM  Radiology DG Chest Portable 1 View  Result Date: 03/07/2021 CLINICAL DATA:  50 y/o female here with c/o abd pain, diarrhea, nausea x -states she took abx last week for "severe head congestion"- EXAM: PORTABLE CHEST 1 VIEW COMPARISON:  04/10/2017. FINDINGS: Normal heart, mediastinum and hila.  Clear lungs.  No pleural effusion or pneumothorax. Skeletal structures are grossly intact. IMPRESSION: No active disease. Electronically Signed   By: Amie Portland M.D.   On: 03/07/2021 16:45    Procedures Procedures   Medications Ordered in ED Medications - No data to display  ED Course  I have reviewed the triage vital signs and the nursing notes.  Pertinent labs & imaging results that were available during my care of the patient were reviewed by me and considered in my medical decision making (see chart for details).    MDM Rules/Calculators/A&P  50 year old female with a history of ITP, Sjogren's (NOT RA and no hx of rheumatoid aorititis), who presents with diarrhea and chest pain in the setting of recent URI symptoms started on cefdinir by telehealth.  Regarding chest pain: Differential diagnosis for chest pain includes pulmonary embolus, dissection, pneumothorax, pneumonia, ACS, myocarditis, pericarditis.  EKG was done and evaluate by me and showed no acute ST changes and no signs of pericarditis. Chest x-ray was done and evaluated by me and radiology and showed no sign of pneumonia or pneumothorax. Patient is PERC negative and low risk Wells, denies dyspnea, and have low suspicion for PE.  Delta troponins both negative and have low suspicion for ACS.  Do not feel history or exam are consistent with aortic dissection. Possible CP related to gastritis in setting of diarrhea as well--recommend outpt follow up of CP. Presentation not consistent with anaphylaxis.  Describes significant diarrhea, possible infectious, side effect of cefdinir or CDiff. Not able to make a sample in ED. Abdominal exam benign, low suspicion for acute surgical emergency. Electrolytes without significant abnormalities. Recommend discontinuing cefdinir and following up as outpatient.    Final Clinical Impression(s) / ED Diagnoses Final diagnoses:  Chest pain, unspecified type  Diarrhea,  unspecified type    Rx / DC Orders ED Discharge Orders     None        Alvira Monday, MD 03/08/21 1355

## 2021-03-07 NOTE — ED Triage Notes (Signed)
Pt c/o abd pain, diarrhea, nausea x -states she took abx last week for "severe head congestion"-NAD-steady gait

## 2021-03-08 LAB — CBC
HCT: 42.4 % (ref 36.0–46.0)
Hemoglobin: 13.7 g/dL (ref 12.0–15.0)
MCH: 29.4 pg (ref 26.0–34.0)
MCHC: 32.3 g/dL (ref 30.0–36.0)
MCV: 91 fL (ref 80.0–100.0)
Platelets: 97 10*3/uL — ABNORMAL LOW (ref 150–400)
RBC: 4.66 MIL/uL (ref 3.87–5.11)
RDW: 13.1 % (ref 11.5–15.5)
WBC: 5.8 10*3/uL (ref 4.0–10.5)
nRBC: 0 % (ref 0.0–0.2)

## 2021-03-09 ENCOUNTER — Encounter (HOSPITAL_COMMUNITY): Payer: Self-pay | Admitting: Emergency Medicine

## 2021-03-09 ENCOUNTER — Emergency Department (HOSPITAL_COMMUNITY)
Admission: EM | Admit: 2021-03-09 | Discharge: 2021-03-10 | Disposition: A | Discharge status: Home / Self Care | Payer: BC Managed Care – PPO | Attending: Emergency Medicine | Admitting: Emergency Medicine

## 2021-03-09 DIAGNOSIS — R197 Diarrhea, unspecified: Secondary | ICD-10-CM | POA: Diagnosis not present

## 2021-03-09 DIAGNOSIS — R0602 Shortness of breath: Secondary | ICD-10-CM | POA: Insufficient documentation

## 2021-03-09 DIAGNOSIS — R42 Dizziness and giddiness: Secondary | ICD-10-CM | POA: Diagnosis not present

## 2021-03-09 DIAGNOSIS — R002 Palpitations: Secondary | ICD-10-CM | POA: Insufficient documentation

## 2021-03-09 DIAGNOSIS — R079 Chest pain, unspecified: Secondary | ICD-10-CM | POA: Diagnosis not present

## 2021-03-09 DIAGNOSIS — J9811 Atelectasis: Secondary | ICD-10-CM | POA: Diagnosis not present

## 2021-03-09 LAB — CBC WITH DIFFERENTIAL/PLATELET
Abs Immature Granulocytes: 0.02 10*3/uL (ref 0.00–0.07)
Basophils Absolute: 0 10*3/uL (ref 0.0–0.1)
Basophils Relative: 1 %
Eosinophils Absolute: 0 10*3/uL (ref 0.0–0.5)
Eosinophils Relative: 0 %
HCT: 43.1 % (ref 36.0–46.0)
Hemoglobin: 14 g/dL (ref 12.0–15.0)
Immature Granulocytes: 0 %
Lymphocytes Relative: 7 %
Lymphs Abs: 0.6 10*3/uL — ABNORMAL LOW (ref 0.7–4.0)
MCH: 29.6 pg (ref 26.0–34.0)
MCHC: 32.5 g/dL (ref 30.0–36.0)
MCV: 91.1 fL (ref 80.0–100.0)
Monocytes Absolute: 0.5 10*3/uL (ref 0.1–1.0)
Monocytes Relative: 6 %
Neutro Abs: 6.6 10*3/uL (ref 1.7–7.7)
Neutrophils Relative %: 86 %
Platelets: 100 10*3/uL — ABNORMAL LOW (ref 150–400)
RBC: 4.73 MIL/uL (ref 3.87–5.11)
RDW: 13 % (ref 11.5–15.5)
WBC: 7.7 10*3/uL (ref 4.0–10.5)
nRBC: 0 % (ref 0.0–0.2)

## 2021-03-09 LAB — URINALYSIS, ROUTINE W REFLEX MICROSCOPIC
Bilirubin Urine: NEGATIVE
Glucose, UA: NEGATIVE mg/dL
Hgb urine dipstick: NEGATIVE
Ketones, ur: NEGATIVE mg/dL
Leukocytes,Ua: NEGATIVE
Nitrite: NEGATIVE
Protein, ur: NEGATIVE mg/dL
Specific Gravity, Urine: 1.008 (ref 1.005–1.030)
pH: 7 (ref 5.0–8.0)

## 2021-03-09 LAB — COMPREHENSIVE METABOLIC PANEL
ALT: 27 U/L (ref 0–44)
AST: 34 U/L (ref 15–41)
Albumin: 3.8 g/dL (ref 3.5–5.0)
Alkaline Phosphatase: 50 U/L (ref 38–126)
Anion gap: 9 (ref 5–15)
BUN: 14 mg/dL (ref 6–20)
CO2: 27 mmol/L (ref 22–32)
Calcium: 9.1 mg/dL (ref 8.9–10.3)
Chloride: 104 mmol/L (ref 98–111)
Creatinine, Ser: 0.97 mg/dL (ref 0.44–1.00)
GFR, Estimated: >60 mL/min (ref >60)
Glucose, Bld: 121 mg/dL — ABNORMAL HIGH (ref 70–99)
Potassium: 4.1 mmol/L (ref 3.5–5.1)
Sodium: 140 mmol/L (ref 135–145)
Total Bilirubin: 0.8 mg/dL (ref 0.3–1.2)
Total Protein: 7.7 g/dL (ref 6.5–8.1)

## 2021-03-09 LAB — TSH: TSH: 2.343 u[IU]/mL (ref 0.350–4.500)

## 2021-03-09 LAB — MAGNESIUM: Magnesium: 2.1 mg/dL (ref 1.7–2.4)

## 2021-03-09 LAB — CBG MONITORING, ED: Glucose-Capillary: 99 mg/dL (ref 70–99)

## 2021-03-09 LAB — TROPONIN I (HIGH SENSITIVITY)
Troponin I (High Sensitivity): 4 ng/L (ref <18)
Troponin I (High Sensitivity): 4 ng/L (ref <18)

## 2021-03-09 NOTE — ED Provider Notes (Addendum)
Emergency Medicine Provider Triage Evaluation Note  Sarah Garza , a 50 y.o. female  was evaluated in triage.  Pt complains of increasing frequency of near syncope.  She states that her symptoms began approximately 4 days ago and have been getting progressively worse.  She states that she simply developed palpitations which is accompanied by blackout vision and a feeling as though she is going to pass out.  She also feels as though her hands will cramp up.  Patient also reports that she has been battling issues with hypoglycemia all day.  Review of Systems  Positive: Near syncope, visual changes, palpitations, chest pain, SOB, nausea, fevers and chills. Negative: Vomiting, bloody stools.   Physical Exam  BP 108/67 (BP Location: Left Arm)   Pulse 83   Temp 99 F (37.2 C) (Oral)   Resp 12   LMP 02/08/2021   SpO2 99%  Gen:   Awake, no distress   Resp:  Normal effort  MSK:   Moves extremities without difficulty   Medical Decision Making  Medically screening exam initiated at 1:52 PM.  Appropriate orders placed.  [name] was informed that the remainder of the evaluation will be completed by another provider, this initial triage assessment does not replace that evaluation, and the importance of remaining in the ED until their evaluation is complete.  She was seen for similar symptoms 03/07/2021.  Suspected gastritis versus infectious diarrhea.  Recommended discontinuation of cefdinir and continued outpatient follow-up.  We will add on a TSH and magnesium in addition to basic labs.  She describes palpitations and near syncope.  We will proceed with cardiac monitoring.  Stable until roomed.   Lorelee New, PA-C 03/09/21 1352    Lorelee New, PA-C 03/09/21 1353    Vanetta Mulders, MD 03/17/21 1350

## 2021-03-09 NOTE — ED Notes (Signed)
Pt not yet in hallway bed at this time

## 2021-03-09 NOTE — ED Triage Notes (Signed)
Pt here with multiple complaints of sob , hypoglycemia , dizziness and cp , [facility] for some of the same symptoms

## 2021-03-10 ENCOUNTER — Emergency Department (HOSPITAL_COMMUNITY): Payer: BC Managed Care – PPO

## 2021-03-10 DIAGNOSIS — R42 Dizziness and giddiness: Secondary | ICD-10-CM | POA: Diagnosis not present

## 2021-03-10 DIAGNOSIS — J9811 Atelectasis: Secondary | ICD-10-CM | POA: Diagnosis not present

## 2021-03-10 DIAGNOSIS — R079 Chest pain, unspecified: Secondary | ICD-10-CM | POA: Diagnosis not present

## 2021-03-10 DIAGNOSIS — R0602 Shortness of breath: Secondary | ICD-10-CM | POA: Diagnosis not present

## 2021-03-10 MED ORDER — IOHEXOL 350 MG/ML SOLN
54.0000 mL | Freq: Once | INTRAVENOUS | Status: AC | PRN
  Administered 2021-03-10: 54 mL via INTRAVENOUS

## 2021-03-10 MED ORDER — LACTATED RINGERS IV BOLUS
1000.0000 mL | Freq: Once | INTRAVENOUS | Status: AC
  Administered 2021-03-10: 1000 mL via INTRAVENOUS

## 2021-03-10 NOTE — ED Provider Notes (Signed)
[facility] EMERGENCY DEPARTMENT Provider Note   CSN: [id number] Arrival date & time: 03/09/21  1239     History No chief complaint on file.   Sarah Garza is a 50 y.o. female.  Seen here a couple days ago for same. Got better. Then came back today. Has episodes of dizziness/palpitations, etc.    Diarrhea Quality:  Watery Severity:  Moderate Onset quality:  Gradual Number of episodes:  25 Timing:  Intermittent Progression:  Improving Relieved by:  Nothing Worsened by:  Nothing Ineffective treatments:  None tried Associated symptoms: no arthralgias and no chills       Past Medical History:  Diagnosis Date   ITP (idiopathic thrombocytopenic purpura)    Low back pain 02/15/2016   Rheumatoid aortitis    Sjoegren syndrome    Syncope     Patient Active Problem List   Diagnosis Date Noted   Palpitations 04/16/2020   Cardiac murmur 04/16/2020   Low back pain 02/15/2016    Past Surgical History:  Procedure Laterality Date   FOOT SURGERY     HERNIA REPAIR       OB History   No obstetric history on file.     Family History  Problem Relation Age of Onset   COPD Father    Cirrhosis Father    Sjogren's syndrome Sister    Hypothyroidism Mother    Rheum arthritis Sister     Social History   Tobacco Use   Smoking status: Never   Smokeless tobacco: Never  Substance Use Topics   Alcohol use: No   Drug use: No    Home Medications Prior to Admission medications   Medication Sig Start Date End Date Taking? Authorizing Provider  acetaminophen (TYLENOL) 500 MG tablet Take 500 mg by mouth every 6 (six) hours as needed for moderate pain or headache.   Yes [provider]  hydroxychloroquine (PLAQUENIL) 200 MG tablet Take 200 mg by mouth daily.   Yes [provider]  Hypromellose (RETAINE [facility] OP) Place 4-5 drops into both eyes in the morning and at bedtime.   Yes [provider]  naproxen sodium (ALEVE) 220 MG  tablet Take 220-440 mg by mouth daily as needed (cramps).   Yes [provider]  Vitamin D, Ergocalciferol, (DRISDOL) 1.25 MG (50000 UNIT) CAPS capsule Take 50,000 Units by mouth every Friday. 03/25/20  Yes [provider]    Allergies    Cefdinir, Doxycycline hyclate, Gluten meal, Levaquin [levofloxacin], and Penicillins  Review of Systems   Review of Systems  Constitutional:  Negative for chills.  Gastrointestinal:  Positive for diarrhea.  Musculoskeletal:  Negative for arthralgias.   Physical Exam Updated Vital Signs BP 97/60 (BP Location: Right Arm)   Pulse 73   Temp 98 F (36.7 C) (Oral)   Resp 18   LMP 02/08/2021   SpO2 100%   Physical Exam  ED Results / Procedures / Treatments   Labs (all labs ordered are listed, but only abnormal results are displayed) Labs Reviewed  CBC WITH DIFFERENTIAL/PLATELET - Abnormal; Notable for the following components:      Result Value   Platelets 100 (*)    Lymphs Abs 0.6 (*)    All other components within normal limits  COMPREHENSIVE METABOLIC PANEL - Abnormal; Notable for the following components:   Glucose, Bld 121 (*)    All other components within normal limits  URINALYSIS, ROUTINE W REFLEX MICROSCOPIC - Abnormal; Notable for the following components:  Color, Urine STRAW (*)    All other components within normal limits  C DIFFICILE QUICK SCREEN W PCR REFLEX    GASTROINTESTINAL PANEL BY PCR, STOOL (REPLACES STOOL CULTURE)  TSH  MAGNESIUM  CBG MONITORING, ED  TROPONIN I (HIGH SENSITIVITY)  TROPONIN I (HIGH SENSITIVITY)    EKG EKG Interpretation  Date/Time:  Wednesday March 09 2021 13:31:43 EDT Ventricular Rate:  87 PR Interval:  142 QRS Duration: 76 QT Interval:  338 QTC Calculation: 406 R Axis:   95 Text Interpretation: Normal sinus rhythm with sinus arrhythmia Rightward axis Borderline ECG since last tracing no significant change Confirmed by Mancel Bale [phone]) on 03/09/2021 9:58:39  PM  Radiology CT Angio Chest PE W and/or Wo Contrast  Result Date: 03/10/2021 CLINICAL DATA:  Shortness of breath. Hyperglycemia. Dizziness and chest pain. Suspected pulmonary embolus. EXAM: CT ANGIOGRAPHY CHEST WITH CONTRAST TECHNIQUE: Multidetector CT imaging of the chest was performed using the standard protocol during bolus administration of intravenous contrast. Multiplanar CT image reconstructions and MIPs were obtained to evaluate the vascular anatomy. CONTRAST:  26mL OMNIPAQUE IOHEXOL 350 MG/ML SOLN COMPARISON:  None. FINDINGS: Cardiovascular: Satisfactory opacification of the pulmonary arteries to the segmental level. No evidence of pulmonary embolism. The main pulmonary artery is normal in caliber. Normal heart size. No significant pericardial effusion. The thoracic aorta is normal in caliber. No atherosclerotic plaque of the thoracic aorta. No coronary artery calcifications. Mediastinum/Nodes: No enlarged mediastinal or hilar lymph nodes. Borderline enlarged 1 cm left axillary lymph node. Thyroid gland, trachea, and esophagus demonstrate no significant findings. Lungs/Pleura: Bilateral lower lobe atelectasis. No focal consolidation. No pulmonary nodule. No pulmonary mass. No pleural effusion. No pneumothorax. No pleural effusion or pneumothorax. Upper Abdomen: No acute abnormality. Musculoskeletal: Dense breast tissue bilaterally. No suspicious lytic or blastic osseous lesions. No acute displaced fracture. Multilevel degenerative changes of the spine. Review of the MIP images confirms the above findings. IMPRESSION: 1. No pulmonary embolus. 2. No acute intrathoracic abnormality. 3. Borderline enlarged 1 cm left axillary lymph node. Recommend correlation with mammography. Electronically Signed   By: Tish Frederickson M.D.   On: 03/10/2021 02:19    Procedures Procedures   Medications Ordered in ED Medications  lactated ringers bolus 1,000 mL (has no administration in time range)  iohexol  (OMNIPAQUE) 350 MG/ML injection 54 mL (54 mLs Intravenous Contrast Given 03/10/21 0136)    ED Course  I have reviewed the triage vital signs and the nursing notes.  Pertinent labs & imaging results that were available during my care of the patient were reviewed by me and considered in my medical decision making (see chart for details).    MDM Rules/Calculators/A&P                          Workup unremarkable. Unclear on actual etiology of symptoms. Possibly GI /related? Will also d/w her cardiologist to determine if it might be arrhythmia?  Final Clinical Impression(s) / ED Diagnoses Final diagnoses:  None    Rx / DC Orders ED Discharge Orders     None        Annelle Behrendt, Barbara Cower, MD 03/10/21 [phone]

## 2021-03-10 NOTE — ED Notes (Signed)
Patient transported to CT 

## 2021-03-11 ENCOUNTER — Other Ambulatory Visit: Payer: Self-pay | Admitting: Family Medicine

## 2021-03-11 ENCOUNTER — Ambulatory Visit
Admission: RE | Admit: 2021-03-11 | Discharge: 2021-03-11 | Disposition: A | Discharge status: Home / Self Care | Payer: BC Managed Care – PPO | Source: Ambulatory Visit | Attending: Family Medicine | Admitting: Family Medicine

## 2021-03-11 ENCOUNTER — Other Ambulatory Visit: Payer: Self-pay

## 2021-03-11 DIAGNOSIS — K8689 Other specified diseases of pancreas: Secondary | ICD-10-CM

## 2021-03-11 DIAGNOSIS — R110 Nausea: Secondary | ICD-10-CM | POA: Diagnosis not present

## 2021-03-11 DIAGNOSIS — R197 Diarrhea, unspecified: Secondary | ICD-10-CM | POA: Diagnosis not present

## 2021-03-11 DIAGNOSIS — Z131 Encounter for screening for diabetes mellitus: Secondary | ICD-10-CM | POA: Diagnosis not present

## 2021-03-11 DIAGNOSIS — K8020 Calculus of gallbladder without cholecystitis without obstruction: Secondary | ICD-10-CM | POA: Diagnosis not present

## 2021-03-11 DIAGNOSIS — E162 Hypoglycemia, unspecified: Secondary | ICD-10-CM

## 2021-03-11 MED ORDER — IOPAMIDOL (ISOVUE-300) INJECTION 61%
100.0000 mL | Freq: Once | INTRAVENOUS | Status: AC | PRN
  Administered 2021-03-11: 100 mL via INTRAVENOUS

## 2021-03-14 ENCOUNTER — Other Ambulatory Visit: Payer: Self-pay | Admitting: Family Medicine

## 2021-03-14 DIAGNOSIS — R251 Tremor, unspecified: Secondary | ICD-10-CM

## 2021-03-15 ENCOUNTER — Other Ambulatory Visit: Payer: Self-pay

## 2021-03-15 ENCOUNTER — Ambulatory Visit
Admission: RE | Admit: 2021-03-15 | Discharge: 2021-03-15 | Disposition: A | Discharge status: Home / Self Care | Payer: BC Managed Care – PPO | Source: Ambulatory Visit | Attending: Family Medicine | Admitting: Family Medicine

## 2021-03-15 DIAGNOSIS — R531 Weakness: Secondary | ICD-10-CM | POA: Diagnosis not present

## 2021-03-15 DIAGNOSIS — H538 Other visual disturbances: Secondary | ICD-10-CM | POA: Diagnosis not present

## 2021-03-15 DIAGNOSIS — R251 Tremor, unspecified: Secondary | ICD-10-CM | POA: Diagnosis not present

## 2021-03-15 DIAGNOSIS — G9389 Other specified disorders of brain: Secondary | ICD-10-CM | POA: Diagnosis not present

## 2021-03-15 MED ORDER — GADOBENATE DIMEGLUMINE 529 MG/ML IV SOLN
10.0000 mL | Freq: Once | INTRAVENOUS | Status: AC | PRN
  Administered 2021-03-15: 10 mL via INTRAVENOUS

## 2021-03-16 DIAGNOSIS — E162 Hypoglycemia, unspecified: Secondary | ICD-10-CM | POA: Diagnosis not present

## 2021-03-16 DIAGNOSIS — Z5321 Procedure and treatment not carried out due to patient leaving prior to being seen by health care provider: Secondary | ICD-10-CM | POA: Diagnosis not present

## 2021-03-17 ENCOUNTER — Other Ambulatory Visit: Payer: Self-pay

## 2021-03-17 ENCOUNTER — Encounter (HOSPITAL_BASED_OUTPATIENT_CLINIC_OR_DEPARTMENT_OTHER): Payer: Self-pay

## 2021-03-17 ENCOUNTER — Emergency Department (HOSPITAL_BASED_OUTPATIENT_CLINIC_OR_DEPARTMENT_OTHER)
Admission: EM | Admit: 2021-03-17 | Discharge: 2021-03-17 | Disposition: A | Discharge status: Home / Self Care | Payer: BC Managed Care – PPO | Attending: Emergency Medicine | Admitting: Emergency Medicine

## 2021-03-17 DIAGNOSIS — R531 Weakness: Secondary | ICD-10-CM | POA: Diagnosis not present

## 2021-03-17 DIAGNOSIS — R251 Tremor, unspecified: Secondary | ICD-10-CM

## 2021-03-17 DIAGNOSIS — R42 Dizziness and giddiness: Secondary | ICD-10-CM | POA: Diagnosis not present

## 2021-03-17 DIAGNOSIS — E162 Hypoglycemia, unspecified: Secondary | ICD-10-CM | POA: Diagnosis not present

## 2021-03-17 DIAGNOSIS — M3500 Sicca syndrome, unspecified: Secondary | ICD-10-CM | POA: Diagnosis not present

## 2021-03-17 DIAGNOSIS — E11649 Type 2 diabetes mellitus with hypoglycemia without coma: Secondary | ICD-10-CM | POA: Diagnosis not present

## 2021-03-17 DIAGNOSIS — R079 Chest pain, unspecified: Secondary | ICD-10-CM | POA: Diagnosis not present

## 2021-03-17 DIAGNOSIS — E1165 Type 2 diabetes mellitus with hyperglycemia: Secondary | ICD-10-CM | POA: Diagnosis not present

## 2021-03-17 DIAGNOSIS — Z20822 Contact with and (suspected) exposure to covid-19: Secondary | ICD-10-CM | POA: Diagnosis not present

## 2021-03-17 DIAGNOSIS — R739 Hyperglycemia, unspecified: Secondary | ICD-10-CM | POA: Diagnosis not present

## 2021-03-17 DIAGNOSIS — N200 Calculus of kidney: Secondary | ICD-10-CM | POA: Diagnosis not present

## 2021-03-17 DIAGNOSIS — R509 Fever, unspecified: Secondary | ICD-10-CM | POA: Diagnosis not present

## 2021-03-17 HISTORY — DX: Type 2 diabetes mellitus without complications: E11.9

## 2021-03-17 LAB — COMPREHENSIVE METABOLIC PANEL
ALT: 33 U/L (ref 0–44)
AST: 31 U/L (ref 15–41)
Albumin: 3.9 g/dL (ref 3.5–5.0)
Alkaline Phosphatase: 57 U/L (ref 38–126)
Anion gap: 6 (ref 5–15)
BUN: 17 mg/dL (ref 6–20)
CO2: 24 mmol/L (ref 22–32)
Calcium: 8.7 mg/dL — ABNORMAL LOW (ref 8.9–10.3)
Chloride: 107 mmol/L (ref 98–111)
Creatinine, Ser: 0.9 mg/dL (ref 0.44–1.00)
GFR, Estimated: >60 mL/min (ref >60)
Glucose, Bld: 105 mg/dL — ABNORMAL HIGH (ref 70–99)
Potassium: 3.7 mmol/L (ref 3.5–5.1)
Sodium: 137 mmol/L (ref 135–145)
Total Bilirubin: 0.7 mg/dL (ref 0.3–1.2)
Total Protein: 7.8 g/dL (ref 6.5–8.1)

## 2021-03-17 LAB — CBC WITH DIFFERENTIAL/PLATELET
Abs Immature Granulocytes: 0.01 10*3/uL (ref 0.00–0.07)
Basophils Absolute: 0.1 10*3/uL (ref 0.0–0.1)
Basophils Relative: 1 %
Eosinophils Absolute: 0.1 10*3/uL (ref 0.0–0.5)
Eosinophils Relative: 1 %
HCT: 41.2 % (ref 36.0–46.0)
Hemoglobin: 13.7 g/dL (ref 12.0–15.0)
Immature Granulocytes: 0 %
Lymphocytes Relative: 15 %
Lymphs Abs: 0.9 10*3/uL (ref 0.7–4.0)
MCH: 29.7 pg (ref 26.0–34.0)
MCHC: 33.3 g/dL (ref 30.0–36.0)
MCV: 89.2 fL (ref 80.0–100.0)
Monocytes Absolute: 0.5 10*3/uL (ref 0.1–1.0)
Monocytes Relative: 9 %
Neutro Abs: 4.2 10*3/uL (ref 1.7–7.7)
Neutrophils Relative %: 74 %
Platelets: 107 10*3/uL — ABNORMAL LOW (ref 150–400)
RBC: 4.62 MIL/uL (ref 3.87–5.11)
RDW: 13.2 % (ref 11.5–15.5)
Smear Review: ADEQUATE
WBC: 5.7 10*3/uL (ref 4.0–10.5)
nRBC: 0 % (ref 0.0–0.2)

## 2021-03-17 LAB — CBG MONITORING, ED
Glucose-Capillary: 97 mg/dL (ref 70–99)
Glucose-Capillary: 94 mg/dL (ref 70–99)

## 2021-03-17 LAB — URINALYSIS, ROUTINE W REFLEX MICROSCOPIC
Bilirubin Urine: NEGATIVE
Glucose, UA: NEGATIVE mg/dL
Hgb urine dipstick: NEGATIVE
Ketones, ur: NEGATIVE mg/dL
Nitrite: NEGATIVE
Protein, ur: NEGATIVE mg/dL
Specific Gravity, Urine: 1.01 (ref 1.005–1.030)
pH: 6.5 (ref 5.0–8.0)

## 2021-03-17 LAB — URINALYSIS, MICROSCOPIC (REFLEX)

## 2021-03-17 NOTE — ED Provider Notes (Signed)
[facility] EMERGENCY DEPARTMENT Provider Note  CSN: [id number] Arrival date & time: 03/17/21 7673    History Chief Complaint  Patient presents with   Blood Sugar Problem    Sarah Garza is a 50 y.o. female presents for re-evaluation of several complaints ongoing for the last few weeks including difficulty breathing, feels like her throat is closing up, shakiness/muscle tremors and labile blood sugars that range from 20-175. She was seen in the ED for similar complaints twice last week with negative ED workups. In PCP office she had labs done showing elevated insulin and C-peptide levels. She had a CT of her pancreas and an MRI of her pituitary which she reports were normal. She has been referred by PCP to see Endocrine but has not been able to get an appointment yet. She states she has been eating and drinking a lot and urinating frequently. She does not have a history of DM and does not take any medications for her sugar.    Past Medical History:  Diagnosis Date   Diabetes mellitus without complication (HCC)    ITP (idiopathic thrombocytopenic purpura)    Low back pain 02/15/2016   Rheumatoid aortitis    Sjoegren syndrome    Syncope     Past Surgical History:  Procedure Laterality Date   FOOT SURGERY     HERNIA REPAIR      Family History  Problem Relation Age of Onset   COPD Father    Cirrhosis Father    Sjogren's syndrome Sister    Hypothyroidism Mother    Rheum arthritis Sister     Social History   Tobacco Use   Smoking status: Never   Smokeless tobacco: Never  Substance Use Topics   Alcohol use: No   Drug use: No     Home Medications Prior to Admission medications   Medication Sig Start Date End Date Taking? Authorizing Provider  acetaminophen (TYLENOL) 500 MG tablet Take 500 mg by mouth every 6 (six) hours as needed for moderate pain or headache.    [provider]  hydroxychloroquine (PLAQUENIL) 200 MG tablet Take 200 mg by  mouth daily.    [provider]  Hypromellose (RETAINE [facility] OP) Place 4-5 drops into both eyes in the morning and at bedtime.    [provider]  naproxen sodium (ALEVE) 220 MG tablet Take 220-440 mg by mouth daily as needed (cramps).    [provider]  Vitamin D, Ergocalciferol, (DRISDOL) 1.25 MG (50000 UNIT) CAPS capsule Take 50,000 Units by mouth every Friday. 03/25/20   [provider]     Allergies    Cefdinir, Doxycycline hyclate, Gluten meal, Levaquin [levofloxacin], and Penicillins   Review of Systems   Review of Systems A comprehensive review of systems was completed and negative except as noted in HPI.    Physical Exam BP (!) 104/59 (BP Location: Left Arm)   Pulse 74   Temp 99.1 F (37.3 C) (Oral)   Resp 16   Ht 5\' 4"  (1.626 m)   Wt 53.4 kg   SpO2 100%   BMI 20.21 kg/m   Physical Exam Vitals and nursing note reviewed.  Constitutional:      Appearance: Normal appearance.  HENT:     Head: Normocephalic and atraumatic.     Nose: Nose normal.     Mouth/Throat:     Mouth: Mucous membranes are moist.  Eyes:     Extraocular Movements: Extraocular movements intact.  Conjunctiva/sclera: Conjunctivae normal.  Cardiovascular:     Rate and Rhythm: Normal rate.  Pulmonary:     Effort: Pulmonary effort is normal.     Breath sounds: Normal breath sounds.  Abdominal:     General: Abdomen is flat.     Palpations: Abdomen is soft.     Tenderness: There is no abdominal tenderness.  Musculoskeletal:        General: No swelling. Normal range of motion.     Cervical back: Neck supple.  Skin:    General: Skin is warm and dry.  Neurological:     General: No focal deficit present.     Mental Status: She is alert.  Psychiatric:        Mood and Affect: Mood normal.     ED Results / Procedures / Treatments   Labs (all labs ordered are listed, but only abnormal results are displayed) Labs Reviewed  COMPREHENSIVE METABOLIC PANEL -  Abnormal; Notable for the following components:      Result Value   Glucose, Bld 105 (*)    Calcium 8.7 (*)    All other components within normal limits  CBC WITH DIFFERENTIAL/PLATELET - Abnormal; Notable for the following components:   Platelets 107 (*)    All other components within normal limits  URINALYSIS, ROUTINE W REFLEX MICROSCOPIC - Abnormal; Notable for the following components:   Leukocytes,Ua SMALL (*)    All other components within normal limits  URINALYSIS, MICROSCOPIC (REFLEX) - Abnormal; Notable for the following components:   Bacteria, UA FEW (*)    All other components within normal limits  CBG MONITORING, ED  CBG MONITORING, ED    EKG None   Radiology MR BRAIN W WO CONTRAST  Result Date: 03/15/2021 CLINICAL DATA:  Tremors, blacking out, blurred vision, weakness in the legs, and difficulty with ambulation and speech. EXAM: MRI HEAD WITHOUT AND WITH CONTRAST TECHNIQUE: Multiplanar, multiecho pulse sequences of the brain and surrounding structures were obtained without and with intravenous contrast. CONTRAST:  10mL MULTIHANCE GADOBENATE DIMEGLUMINE 529 MG/ML IV SOLN COMPARISON:  07/19/2011 FINDINGS: Brain: There is no evidence of an acute infarct, intracranial hemorrhage, midline shift, or extra-axial fluid collection. The lateral and third ventricles are normal in size. No abnormal intracranial enhancement is identified. An abnormal appearance of the posterior fossa is unchanged and likely congenital as described on the prior MRI. This includes moderate enlargement of the fourth ventricle with decreased cerebellar volume, prominence of the superior cerebellar cistern, and abnormal occipital lobe gyral pattern with herniation of portions of the inferomedial occipital lobes through hypoplastic tentorium (right greater than left). Asymmetric extra-axial CSF in in the right cerebellopontine angle region is unchanged and suggestive of a small arachnoid cyst. Mild enlargement of  the cisterna magna, most notable to the left of midline, is unchanged. A 2 mm T1 and FLAIR hyperintense nodule in the left cerebellopontine angle along the medial aspect of the left trigeminal nerve is unchanged and may represent a tiny lipoma. Vascular: Major intracranial vascular flow voids are preserved. Skull and upper cervical spine: Unremarkable bone marrow signal. Sinuses/Orbits: Unremarkable orbits. Paranasal sinuses and mastoid air cells are clear. Other: None. IMPRESSION: 1. No acute intracranial abnormality. 2. 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 Electronically Signed   By: Sebastian Ache M.D.   On: 03/15/2021 14:28    Procedures Procedures  Medications Ordered in the ED Medications - No data to display   MDM Rules/Calculators/A&P MDM Patient with unusual constellation  of symptoms, likely endocrine in etiology but still present even when she is not hypoglycemic. Will check labs in the ED to determine any acute change from previous.   ED Course  I have reviewed the triage vital signs and the nursing notes.  Pertinent labs & imaging results that were available during my care of the patient were reviewed by me and considered in my medical decision making (see chart for details).  Clinical Course as of 03/17/21 1029  Thu Mar 17, 2021  3875 CMP is normal. UA without signs of infection or glucosuria.  [CS]  0849 CBC is normal.  [CS]  0914 CBG remains unchanged. No other concerning findings on ED lab evaluation. Suspect an underlying endocrine problem. Recommend she continue to monitor her glucose at home and be sure to eat/drink something sweet if it drops again. Otherwise close PCP follow up for further management.  [CS]    Clinical Course User Index [CS] Pollyann Savoy, MD    Final Clinical Impression(s) / ED Diagnoses Final diagnoses:  Generalized weakness  Hypoglycemia  Tremor    Rx / DC Orders ED Discharge Orders      None        Pollyann Savoy, MD 03/17/21 1029

## 2021-03-17 NOTE — ED Triage Notes (Signed)
Pt states her blood sugars have been up and down for several days. Reports low of 20 and high of 175. Also states feels like her throat closes up at times. States she was shaky all night and unable to sleep. CBG 97 at time of triage.

## 2021-03-17 NOTE — ED Notes (Signed)
Patient ambulated to bathroom without assistance. Providing urine sample at this time.

## 2021-03-21 ENCOUNTER — Encounter (HOSPITAL_BASED_OUTPATIENT_CLINIC_OR_DEPARTMENT_OTHER): Payer: Self-pay | Admitting: *Deleted

## 2021-03-21 ENCOUNTER — Other Ambulatory Visit: Payer: Self-pay

## 2021-03-21 ENCOUNTER — Emergency Department (HOSPITAL_BASED_OUTPATIENT_CLINIC_OR_DEPARTMENT_OTHER)
Admission: EM | Admit: 2021-03-21 | Discharge: 2021-03-21 | Disposition: A | Discharge status: Home / Self Care | Payer: BC Managed Care – PPO | Attending: Emergency Medicine | Admitting: Emergency Medicine

## 2021-03-21 ENCOUNTER — Emergency Department (HOSPITAL_COMMUNITY): Payer: BC Managed Care – PPO

## 2021-03-21 DIAGNOSIS — M545 Low back pain, unspecified: Secondary | ICD-10-CM | POA: Diagnosis not present

## 2021-03-21 DIAGNOSIS — R531 Weakness: Secondary | ICD-10-CM | POA: Insufficient documentation

## 2021-03-21 DIAGNOSIS — M5459 Other low back pain: Secondary | ICD-10-CM | POA: Diagnosis not present

## 2021-03-21 DIAGNOSIS — S343XXA Injury of cauda equina, initial encounter: Secondary | ICD-10-CM | POA: Diagnosis not present

## 2021-03-21 DIAGNOSIS — X58XXXA Exposure to other specified factors, initial encounter: Secondary | ICD-10-CM | POA: Diagnosis not present

## 2021-03-21 DIAGNOSIS — E119 Type 2 diabetes mellitus without complications: Secondary | ICD-10-CM | POA: Insufficient documentation

## 2021-03-21 DIAGNOSIS — R202 Paresthesia of skin: Secondary | ICD-10-CM | POA: Insufficient documentation

## 2021-03-21 DIAGNOSIS — R253 Fasciculation: Secondary | ICD-10-CM

## 2021-03-21 DIAGNOSIS — Z20822 Contact with and (suspected) exposure to covid-19: Secondary | ICD-10-CM | POA: Insufficient documentation

## 2021-03-21 LAB — CBC WITH DIFFERENTIAL/PLATELET
Abs Immature Granulocytes: 0.02 10*3/uL (ref 0.00–0.07)
Basophils Absolute: 0.1 10*3/uL (ref 0.0–0.1)
Basophils Relative: 1 %
Eosinophils Absolute: 0 10*3/uL (ref 0.0–0.5)
Eosinophils Relative: 1 %
HCT: 37.1 % (ref 36.0–46.0)
Hemoglobin: 12.3 g/dL (ref 12.0–15.0)
Immature Granulocytes: 0 %
Lymphocytes Relative: 20 %
Lymphs Abs: 1.1 10*3/uL (ref 0.7–4.0)
MCH: 29.7 pg (ref 26.0–34.0)
MCHC: 33.2 g/dL (ref 30.0–36.0)
MCV: 89.6 fL (ref 80.0–100.0)
Monocytes Absolute: 0.6 10*3/uL (ref 0.1–1.0)
Monocytes Relative: 11 %
Neutro Abs: 3.5 10*3/uL (ref 1.7–7.7)
Neutrophils Relative %: 67 %
Platelets: 90 10*3/uL — ABNORMAL LOW (ref 150–400)
RBC: 4.14 MIL/uL (ref 3.87–5.11)
RDW: 13.5 % (ref 11.5–15.5)
WBC: 5.2 10*3/uL (ref 4.0–10.5)
nRBC: 0 % (ref 0.0–0.2)

## 2021-03-21 LAB — URINALYSIS, ROUTINE W REFLEX MICROSCOPIC
Bilirubin Urine: NEGATIVE
Glucose, UA: NEGATIVE mg/dL
Hgb urine dipstick: NEGATIVE
Ketones, ur: NEGATIVE mg/dL
Leukocytes,Ua: NEGATIVE
Nitrite: NEGATIVE
Protein, ur: NEGATIVE mg/dL
Specific Gravity, Urine: 1.009 (ref 1.005–1.030)
pH: 6.5 (ref 5.0–8.0)

## 2021-03-21 LAB — CBG MONITORING, ED: Glucose-Capillary: 105 mg/dL — ABNORMAL HIGH (ref 70–99)

## 2021-03-21 LAB — COMPREHENSIVE METABOLIC PANEL
ALT: 21 U/L (ref 0–44)
AST: 23 U/L (ref 15–41)
Albumin: 4.2 g/dL (ref 3.5–5.0)
Alkaline Phosphatase: 45 U/L (ref 38–126)
Anion gap: 9 (ref 5–15)
BUN: 18 mg/dL (ref 6–20)
CO2: 24 mmol/L (ref 22–32)
Calcium: 9.1 mg/dL (ref 8.9–10.3)
Chloride: 106 mmol/L (ref 98–111)
Creatinine, Ser: 0.79 mg/dL (ref 0.44–1.00)
GFR, Estimated: >60 mL/min (ref >60)
Glucose, Bld: 89 mg/dL (ref 70–99)
Potassium: 3.6 mmol/L (ref 3.5–5.1)
Sodium: 139 mmol/L (ref 135–145)
Total Bilirubin: 0.8 mg/dL (ref 0.3–1.2)
Total Protein: 7.4 g/dL (ref 6.5–8.1)

## 2021-03-21 LAB — RESP PANEL BY RT-PCR (FLU A&B, COVID) ARPGX2
Influenza A by PCR: NEGATIVE
Influenza B by PCR: NEGATIVE
SARS Coronavirus 2 by RT PCR: NEGATIVE

## 2021-03-21 MED ORDER — METHOCARBAMOL 500 MG PO TABS: 500.0000 mg | ORAL_TABLET | Freq: Four times a day (QID) | ORAL | 0 refills | Status: DC | PRN

## 2021-03-21 MED ORDER — SODIUM CHLORIDE 0.9 % IV SOLN: INTRAVENOUS | Status: DC

## 2021-03-21 NOTE — Discharge Instructions (Addendum)
Take robaxin as needed for muscle spasms   See neurology and neurosurgery for follow up   Return to ER if you have worse muscle spasms, back pain, trouble urinating or trouble with bowel movements

## 2021-03-21 NOTE — ED Notes (Signed)
Pt at [facility] for MRI from [facility]. Pt reports that approximately Saturday she became aware extremely sensitive spots alone spine, loss of urination sensation/loss of void control, pressure in her lower abdominal/pelvis area, weakness in bilateral legs, and numbness in bilateral feet.

## 2021-03-21 NOTE — ED Notes (Signed)
Report called to Shannon with [facility]. 

## 2021-03-21 NOTE — ED Notes (Signed)
Telephone report called to Asher Muir, charge nurse at [facility] ED.

## 2021-03-21 NOTE — ED Notes (Signed)
RT Note: Fingerstick done for blood sugar, tolerated well, RN made aware of results, downloaded into Epic.

## 2021-03-21 NOTE — ED Notes (Signed)
Patient here from [facility] for MRI. Alert and orientated x 4.

## 2021-03-21 NOTE — ED Triage Notes (Addendum)
Over last month pt has had trouble with her blood sugar staying regulated. Reports 4 sensitive areas on spine. When touched states she feels CP, and like she is going to pass out, becomes nauseated. Also reports loss of B&B control.

## 2021-03-21 NOTE — ED Provider Notes (Signed)
[facility] EMERGENCY DEPT Provider Note   CSN: [id number] Arrival date & time: 03/21/21  7893     History Chief Complaint  Patient presents with   Back Pain    Sarah Garza is a 50 y.o. female.  Patient with multiple ED visits in the month of June.  Patient seen June 20 at [facility], June 22 at [facility], June 30 at [facility], and then also seen at [facility] emergency department on June 30.  Recent studies include MRI brain without significant findings on June 28.  CTA chest on June 23 negative for PE.  And CT abdomen on June 24 that showed cholelithiasis.  Patient has a history of Sojourn syndrome ITP and diabetes.  Patient's primary care doctor ordered the MRI brain.  Patient's main concern today is that something is wrong with her spine.  She states that if she lays back she gets some upper thoracic back pain and feels like she is got a passout and cannot breathe.  But more importantly is the fact that she says she is got bilateral lower extremity weakness and numbness on the top and bottom of her feet bilaterally.  And also has incontinence of stool and urine.  Patient states that she has had this lumbar back pain since June 30 and that the weakness has been since June 20 but the incontinence just started a couple days ago.  Previously she had presented for reevaluation of several complaints ongoing for the several weeks to include difficulty breathing feels like her throat is closing up shakiness muscle tremors labile blood sugars.  Patient has a stated seen in the emergency department similar pains for the past 2 weeks with negative work-ups.  Patient is also followed by endocrine.  But not been able to get appointment yet.      Past Medical History:  Diagnosis Date   Diabetes mellitus without complication (HCC)    ITP (idiopathic thrombocytopenic purpura)    Low back pain 02/15/2016   Rheumatoid aortitis    Sjoegren syndrome    Syncope      Patient Active Problem List   Diagnosis Date Noted   Palpitations 04/16/2020   Cardiac murmur 04/16/2020   Low back pain 02/15/2016    Past Surgical History:  Procedure Laterality Date   FOOT SURGERY     HERNIA REPAIR       OB History   No obstetric history on file.     Family History  Problem Relation Age of Onset   COPD Father    Cirrhosis Father    Sjogren's syndrome Sister    Hypothyroidism Mother    Rheum arthritis Sister     Social History   Tobacco Use   Smoking status: Never   Smokeless tobacco: Never  Vaping Use   Vaping Use: Never used  Substance Use Topics   Alcohol use: No   Drug use: No    Home Medications Prior to Admission medications   Medication Sig Start Date End Date Taking? Authorizing Provider  acetaminophen (TYLENOL) 500 MG tablet Take 500 mg by mouth every 6 (six) hours as needed for moderate pain or headache.   Yes [provider]  hydroxychloroquine (PLAQUENIL) 200 MG tablet Take 200 mg by mouth daily.   Yes [provider]  Hypromellose (RETAINE [facility] OP) Place 4-5 drops into both eyes in the morning and at bedtime.   Yes [provider]  naproxen sodium (ALEVE) 220 MG tablet  Take 220-440 mg by mouth daily as needed (cramps).   Yes [provider]  Vitamin D, Ergocalciferol, (DRISDOL) 1.25 MG (50000 UNIT) CAPS capsule Take 50,000 Units by mouth every Friday. 03/25/20  Yes [provider]    Allergies    Cefdinir, Doxycycline hyclate, Gluten meal, Levaquin [levofloxacin], and Penicillins  Review of Systems   Review of Systems  Constitutional:  Negative for chills and fever.  HENT:  Negative for ear pain and sore throat.   Eyes:  Negative for pain and visual disturbance.  Respiratory:  Negative for cough and shortness of breath.   Cardiovascular:  Negative for chest pain and palpitations.  Gastrointestinal:  Negative for abdominal pain and vomiting.  Genitourinary:  Negative for dysuria  and hematuria.  Musculoskeletal:  Positive for back pain and neck pain. Negative for arthralgias and neck stiffness.  Skin:  Negative for color change and rash.  Neurological:  Positive for tremors, weakness, light-headedness and numbness. Negative for seizures, syncope, facial asymmetry, speech difficulty and headaches.  All other systems reviewed and are negative.  Physical Exam Updated Vital Signs BP (!) 92/58   Pulse 76   Temp 98.8 F (37.1 C) (Oral)   Resp 15   Ht 1.638 m (5' 4.5")   Wt 53.5 kg   SpO2 100%   BMI 19.94 kg/m   Physical Exam Vitals and nursing note reviewed.  Constitutional:      General: She is not in acute distress.    Appearance: She is well-developed. She is ill-appearing.  HENT:     Head: Normocephalic and atraumatic.  Eyes:     Extraocular Movements: Extraocular movements intact.     Conjunctiva/sclera: Conjunctivae normal.     Pupils: Pupils are equal, round, and reactive to light.  Cardiovascular:     Rate and Rhythm: Normal rate and regular rhythm.     Heart sounds: No murmur heard. Pulmonary:     Effort: Pulmonary effort is normal. No respiratory distress.     Breath sounds: Normal breath sounds.  Abdominal:     Palpations: Abdomen is soft.     Tenderness: There is no abdominal tenderness.  Musculoskeletal:        General: Tenderness present.     Cervical back: Normal range of motion and neck supple. No rigidity or tenderness.     Comments: Patient with tenderness to lumbar back.  Sphincter tone decreased.  This to the top and bottom of both feet.  Some weakness to the left foot with flexion and extension of toes.  Milder so on the right.  Patient is able to stand.  Skin:    General: Skin is warm and dry.  Neurological:     Mental Status: She is alert.     Cranial Nerves: No cranial nerve deficit.     Sensory: Sensory deficit present.     Motor: Weakness present.     Comments: Numbness to the top and bottom of both feet.  Some motor  weakness to toe flexion foot flexion and extension left greater than right.  But patient is able to stand.  Upper extremities normal.    ED Results / Procedures / Treatments   Labs (all labs ordered are listed, but only abnormal results are displayed) Labs Reviewed  CBC WITH DIFFERENTIAL/PLATELET - Abnormal; Notable for the following components:      Result Value   Platelets 90 (*)    All other components within normal limits  URINALYSIS, ROUTINE W REFLEX MICROSCOPIC - Abnormal;  Notable for the following components:   Color, Urine COLORLESS (*)    All other components within normal limits  CBG MONITORING, ED - Abnormal; Notable for the following components:   Glucose-Capillary 105 (*)    All other components within normal limits  RESP PANEL BY RT-PCR (FLU A&B, COVID) ARPGX2  COMPREHENSIVE METABOLIC PANEL    EKG None  Radiology No results found.  Procedures Procedures   Medications Ordered in ED Medications  0.9 %  sodium chloride infusion ( Intravenous New Bag/Given 03/21/21 1114)    ED Course  I have reviewed the triage vital signs and the nursing notes.  Pertinent labs & imaging results that were available during my care of the patient were reviewed by me and considered in my medical decision making (see chart for details).    MDM Rules/Calculators/A&P                          Patient being transferred to the [facility] ED for MRI of the lumbar back to rule out cauda equina syndrome.  Patient with a lot of other neurological concerns does have Sjogren's syndrome.  Patient had a normal MRI of the brain June 28.  Or at least no acute changes.  Patient also had CT abdomen on June 24 had CT a of chest on June 23 which would get a good look at the thoracic and lumbar spine from a bony standpoint.  But patient seems to have some decreased finger tone clinically is talk about incontinence of urine and stool.  Does seem to have some lower extremity weakness and numbness.  Based on  this patient will get MRI to rule out cauda equina.  If MRI is negative patient can be discharged and follow-up with her primary care doctor. Final Clinical Impression(s) / ED Diagnoses Final diagnoses:  Cauda equina spinal cord injury, initial encounter [facility])    Rx / DC Orders ED Discharge Orders     None        Vanetta Mulders, MD 03/21/21 1718

## 2021-03-21 NOTE — ED Provider Notes (Signed)
  Physical Exam  BP 101/60 (BP Location: Right Arm)   Pulse 79   Temp 98.8 F (37.1 C) (Oral)   Resp 16   Ht 5' 4.5" (1.638 m)   Wt 53.5 kg   SpO2 100%   BMI 19.94 kg/m   Physical Exam  ED Course/Procedures     Procedures  MDM  Patient here with also tremors and back pain.  Patient had multiple ED visits already.  She already had a MRI brain and CTA chest and CT abdomen pelvis.  [facility] and was sent here for MRI of the lumbar spine.  Patient apparently had some muscle fasciculations and also several episodes of incontinence.  7:56 PM MRI did not show any high-grade stenosis.  Patient states that she is getting a referral to neurosurgery by her doctor.  I think she may benefit from seeing neurology as well for her muscle fasciculations.  We will get some muscle relaxants as needed         Charlynne Pander, MD 03/21/21 [phone]

## 2021-03-23 DIAGNOSIS — M62838 Other muscle spasm: Secondary | ICD-10-CM | POA: Diagnosis not present

## 2021-03-24 ENCOUNTER — Encounter: Payer: Self-pay | Admitting: Neurology

## 2021-03-24 ENCOUNTER — Ambulatory Visit: Payer: BC Managed Care – PPO | Admitting: Neurology

## 2021-03-24 VITALS — BP 93/59 | HR 78 | Ht 64.0 in | Wt 121.0 lb

## 2021-03-24 DIAGNOSIS — G8929 Other chronic pain: Secondary | ICD-10-CM | POA: Diagnosis not present

## 2021-03-24 DIAGNOSIS — M791 Myalgia, unspecified site: Secondary | ICD-10-CM

## 2021-03-24 DIAGNOSIS — G253 Myoclonus: Secondary | ICD-10-CM

## 2021-03-24 DIAGNOSIS — M5440 Lumbago with sciatica, unspecified side: Secondary | ICD-10-CM

## 2021-03-24 MED ORDER — CLONAZEPAM 0.5 MG PO TABS: 0.5000 mg | ORAL_TABLET | Freq: Every day | ORAL | 2 refills | Status: DC

## 2021-03-24 NOTE — Progress Notes (Signed)
Reason for visit: Multiple somatic complaints  Referring physician: Pella  Sarah Garza is a 50 y.o. female  History of present illness:  Sarah Garza is a 50 year old right-handed white female with a history of issues with low back pain and leg pain as well as dizziness and urinary and fecal incontinence in the past.  The patient was seen in May 2017 for these issues.  MRI of the lumbar spine at that time was relatively unremarkable, she was sent for MRI of the cervical and thoracic spine but this was never done.  The patient returns with some similar symptoms.  Within the last 3 weeks, she had a viral illness that she thought was the flu.  Since that time, the patient has been having sudden episodes of low blood sugar on 3 occasions.  The blood sugars may drop rapidly into the 20s and the patient will have syncope or near syncope with this.  She apparently has had documented elevations in insulin levels.  The patient however has a relatively normal hemoglobin A1c of 5.6.  The patient has since begun to have pain from the neck all the way down the back to the low back.  She has hypersensitivity with even light touch on her low back.  She has had episodes of blurred vision and feels that her throat is closing up at times.  She has had some twitching sensations on the face.  She has noted that when she lies down that she will develop tremor in the legs, if she remains lying down, the tremors will spread to the arms, and she will eventually will develop jerks in the legs that may come on one side first and then spread to the other.  The episodes tend not to occur when she is up on her feet.  The patient once again has had some incontinence of the bowels and occasionally with the bladder.  The patient may have shortness of breath when she wakes up.  She has some difficulty with concentration focusing at times.  She has had a recent MRI of the brain and lumbar spine that were unremarkable.  She is  sent to this office for further evaluation.  Past Medical History:  Diagnosis Date   Diabetes mellitus without complication (HCC)    ITP (idiopathic thrombocytopenic purpura)    Low back pain 02/15/2016   Rheumatoid aortitis    Sjoegren syndrome    Syncope     Past Surgical History:  Procedure Laterality Date   FOOT SURGERY     HERNIA REPAIR      Family History  Problem Relation Age of Onset   COPD Father    Cirrhosis Father    Sjogren's syndrome Sister    Hypothyroidism Mother    Rheum arthritis Sister     Social history:  reports that she has never smoked. She has never used smokeless tobacco. She reports that she does not drink alcohol and does not use drugs.  Medications:  Prior to Admission medications   Medication Sig Start Date End Date Taking? Authorizing Provider  acetaminophen (TYLENOL) 500 MG tablet Take 500 mg by mouth every 6 (six) hours as needed for moderate pain or headache.   Yes [provider]  hydroxychloroquine (PLAQUENIL) 200 MG tablet Take 200 mg by mouth daily.   Yes [provider]  Hypromellose (RETAINE [facility] OP) Place 4-5 drops into both eyes in the morning and at bedtime.   Yes [provider]  naproxen sodium (ALEVE) 220 MG tablet Take 220-440 mg by mouth daily as needed (cramps).   Yes [provider]  Vitamin D, Ergocalciferol, (DRISDOL) 1.25 MG (50000 UNIT) CAPS capsule Take 50,000 Units by mouth every Friday. 03/25/20  Yes [provider]      Allergies  Allergen Reactions   Cefdinir Shortness Of Breath and Diarrhea   Doxycycline Hyclate     Other reaction(s): severe thrush when on doxycycline for a long time.   Gluten Meal    Levaquin [Levofloxacin] Other (See Comments)    Joint pain   Penicillins Hives   Robaxin [Methocarbamol]     Caused hotness, and nausea     ROS:  Out of a complete 14 system review of symptoms, the patient complains only of the following symptoms, and all other  reviewed systems are negative.  Leg twitching Back pain Bowel incontinence Decreased concentration  Height 5\' 4"  (1.626 m), weight 121 lb (54.9 kg).  Physical Exam  General: The patient is alert and cooperative at the time of the examination.  Eyes: Pupils are equal, round, and reactive to light. Discs are flat bilaterally.  Neck: The neck is supple, no carotid bruits are noted.  Respiratory: The respiratory examination is clear.  Cardiovascular: The cardiovascular examination reveals a regular rate and rhythm, no obvious murmurs or rubs are noted.  Skin: Extremities are without significant edema.  Neurologic Exam  Mental status: The patient is alert and oriented x 3 at the time of the examination. The patient has apparent normal recent and remote memory, with an apparently normal attention span and concentration ability.  Cranial nerves: Facial symmetry is present. There is good sensation of the face to pinprick and soft touch bilaterally. The strength of the facial muscles and the muscles to head turning and shoulder shrug are normal bilaterally. Speech is well enunciated, no aphasia or dysarthria is noted. Extraocular movements are full. Visual fields are full. The tongue is midline, and the patient has symmetric elevation of the soft palate. No obvious hearing deficits are noted.  Motor: The motor testing reveals 5 over 5 strength of all 4 extremities. Good symmetric motor tone is noted throughout.  Sensory: Sensory testing is intact to pinprick, soft touch, vibration sensation, and position sense on all 4 extremities. No evidence of extinction is noted.  Coordination: Cerebellar testing reveals good finger-nose-finger and heel-to-shin bilaterally.  Gait and station: Gait is normal. Tandem gait is slightly unsteady. Romberg is negative but is unsteady, the patient has a slight tendency to lean backwards. No drift is seen.  Reflexes: Deep tendon reflexes are symmetric and  normal bilaterally. Toes are downgoing bilaterally.   MRI lumbar 03/21/21:  IMPRESSION: No substantial change since 2017 examination. Minor degenerative changes are present. There is no high-grade stenosis.   * MRI scan images were reviewed online. I agree with the written report.   MRI brain 03/15/21:  IMPRESSION: 1. No acute intracranial abnormality. 2. 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    Assessment/Plan:  1.  Back discomfort  2.  Tremor and jerking of the legs  3.  Bowel incontinence  The clinical examination is relatively unremarkable.  The etiology of all of her symptoms is not clear but may be related to a stress reaction, I am not clear that the leg tremors and jerks are organic, they provided me with a video of the tremors, they appear to be nonorganic.  The patient however has had episodes of  hypoglycemia with elevated levels of insulin, she will be seeing an endocrinologist in the near future.  The patient will be set up for MRI of the cervical and thoracic spine, further blood work will be done today.  She will be placed on clonazepam 0.5 mg at night.  She will follow-up here in 3 to 4 months.  Marlan Palau MD 03/24/2021 2:13 PM  [facility] [address] [place], [place] [address]  Phone [phone] Fax [phone]

## 2021-03-25 LAB — HIV ANTIBODY (ROUTINE TESTING W REFLEX): HIV Screen 4th Generation wRfx: NONREACTIVE

## 2021-03-25 LAB — ENA+DNA/DS+SJORGEN'S
ENA RNP Ab: 0.9 AI (ref 0.0–0.9)
ENA SM Ab Ser-aCnc: 0.2 AI (ref 0.0–0.9)
ENA SSA (RO) Ab: >8 AI — ABNORMAL HIGH (ref 0.0–0.9)
ENA SSB (LA) Ab: >8 AI — ABNORMAL HIGH (ref 0.0–0.9)
dsDNA Ab: <1 IU/mL (ref 0–9)

## 2021-03-25 LAB — SEDIMENTATION RATE: Sed Rate: 15 mm/hr (ref 0–40)

## 2021-03-25 LAB — ANGIOTENSIN CONVERTING ENZYME: Angio Convert Enzyme: 47 U/L (ref 14–82)

## 2021-03-25 LAB — LYME DISEASE SEROLOGY W/REFLEX: Lyme Total Antibody EIA: NEGATIVE

## 2021-03-25 LAB — ANA W/REFLEX: Anti Nuclear Antibody (ANA): POSITIVE — AB

## 2021-03-25 LAB — RPR: RPR Ser Ql: NONREACTIVE

## 2021-03-25 LAB — GLUTAMIC ACID DECARBOXYLASE AUTO ABS: Glutamic Acid Decarb Ab: <5 U/mL (ref 0.0–5.0)

## 2021-03-25 LAB — CK: Total CK: 58 U/L (ref 32–182)

## 2021-03-28 ENCOUNTER — Telehealth: Payer: Self-pay | Admitting: Neurology

## 2021-03-28 NOTE — Telephone Encounter (Signed)
I called patient's insurance @ [phone] and spoke with Amy to start PA for MRI cervical spine wo contrast ([id number]) and MRI thoracic spine wo contrast ([id number]). Amy was able to approve both MRIs. PA #[id number] (03/28/21- 04/27/21). I will call patient to schedule at [facility].

## 2021-03-30 ENCOUNTER — Ambulatory Visit: Payer: BC Managed Care – PPO

## 2021-03-30 DIAGNOSIS — G8929 Other chronic pain: Secondary | ICD-10-CM

## 2021-03-30 DIAGNOSIS — Z0000 Encounter for general adult medical examination without abnormal findings: Secondary | ICD-10-CM | POA: Diagnosis not present

## 2021-03-30 DIAGNOSIS — G253 Myoclonus: Secondary | ICD-10-CM

## 2021-03-30 DIAGNOSIS — M791 Myalgia, unspecified site: Secondary | ICD-10-CM

## 2021-03-30 DIAGNOSIS — H6121 Impacted cerumen, right ear: Secondary | ICD-10-CM | POA: Diagnosis not present

## 2021-03-30 DIAGNOSIS — M5441 Lumbago with sciatica, right side: Secondary | ICD-10-CM

## 2021-03-30 DIAGNOSIS — M5440 Lumbago with sciatica, unspecified side: Secondary | ICD-10-CM | POA: Diagnosis not present

## 2021-03-31 DIAGNOSIS — I959 Hypotension, unspecified: Secondary | ICD-10-CM | POA: Diagnosis not present

## 2021-03-31 DIAGNOSIS — R7309 Other abnormal glucose: Secondary | ICD-10-CM | POA: Diagnosis not present

## 2021-04-01 ENCOUNTER — Telehealth: Payer: Self-pay | Admitting: Neurology

## 2021-04-01 DIAGNOSIS — R7309 Other abnormal glucose: Secondary | ICD-10-CM | POA: Diagnosis not present

## 2021-04-01 DIAGNOSIS — I959 Hypotension, unspecified: Secondary | ICD-10-CM | POA: Diagnosis not present

## 2021-04-01 NOTE — Telephone Encounter (Signed)
I called the patient.  MRI of the cervical and thoracic spine were relatively unremarkable.  The blood work showed a positive ANA and a pattern on the antibody panel consistent with Sjogren's syndrome, the patient has known Sjogren's syndrome.    MRI cervical 03/31/21:  IMPRESSION: Mildly abnormal MRI scan cervical spine without contrast showing mild spondylitic changes at C5-6 and C6-7 but no significant compression.   MRI thoracic 03/31/21:  IMPRESSION: Unremarkable MRI scan of the thoracic spine without contrast.

## 2021-04-15 DIAGNOSIS — M62830 Muscle spasm of back: Secondary | ICD-10-CM | POA: Diagnosis not present

## 2021-04-15 DIAGNOSIS — Z6820 Body mass index (BMI) 20.0-20.9, adult: Secondary | ICD-10-CM | POA: Diagnosis not present

## 2021-04-15 DIAGNOSIS — K5900 Constipation, unspecified: Secondary | ICD-10-CM | POA: Diagnosis not present

## 2021-04-22 DIAGNOSIS — E119 Type 2 diabetes mellitus without complications: Secondary | ICD-10-CM | POA: Diagnosis not present

## 2021-04-25 DIAGNOSIS — R7309 Other abnormal glucose: Secondary | ICD-10-CM | POA: Diagnosis not present

## 2021-04-26 DIAGNOSIS — M545 Low back pain, unspecified: Secondary | ICD-10-CM | POA: Diagnosis not present

## 2021-04-26 DIAGNOSIS — M546 Pain in thoracic spine: Secondary | ICD-10-CM | POA: Diagnosis not present

## 2021-04-26 DIAGNOSIS — M549 Dorsalgia, unspecified: Secondary | ICD-10-CM | POA: Diagnosis not present

## 2021-04-28 DIAGNOSIS — M546 Pain in thoracic spine: Secondary | ICD-10-CM | POA: Diagnosis not present

## 2021-04-28 DIAGNOSIS — M545 Low back pain, unspecified: Secondary | ICD-10-CM | POA: Diagnosis not present

## 2021-04-28 DIAGNOSIS — M549 Dorsalgia, unspecified: Secondary | ICD-10-CM | POA: Diagnosis not present

## 2021-05-06 DIAGNOSIS — M546 Pain in thoracic spine: Secondary | ICD-10-CM | POA: Diagnosis not present

## 2021-05-06 DIAGNOSIS — M545 Low back pain, unspecified: Secondary | ICD-10-CM | POA: Diagnosis not present

## 2021-05-06 DIAGNOSIS — M549 Dorsalgia, unspecified: Secondary | ICD-10-CM | POA: Diagnosis not present

## 2021-05-09 DIAGNOSIS — M549 Dorsalgia, unspecified: Secondary | ICD-10-CM | POA: Diagnosis not present

## 2021-05-09 DIAGNOSIS — M546 Pain in thoracic spine: Secondary | ICD-10-CM | POA: Diagnosis not present

## 2021-05-09 DIAGNOSIS — M545 Low back pain, unspecified: Secondary | ICD-10-CM | POA: Diagnosis not present

## 2021-05-11 ENCOUNTER — Other Ambulatory Visit (HOSPITAL_COMMUNITY): Payer: Self-pay | Admitting: Family Medicine

## 2021-05-11 DIAGNOSIS — R609 Edema, unspecified: Secondary | ICD-10-CM

## 2021-05-11 DIAGNOSIS — M5430 Sciatica, unspecified side: Secondary | ICD-10-CM | POA: Diagnosis not present

## 2021-05-11 DIAGNOSIS — M7989 Other specified soft tissue disorders: Secondary | ICD-10-CM | POA: Diagnosis not present

## 2021-05-12 ENCOUNTER — Ambulatory Visit (HOSPITAL_COMMUNITY)
Admission: RE | Admit: 2021-05-12 | Discharge: 2021-05-12 | Disposition: A | Discharge status: Home / Self Care | Payer: BC Managed Care – PPO | Source: Ambulatory Visit | Attending: Family Medicine | Admitting: Family Medicine

## 2021-05-12 ENCOUNTER — Other Ambulatory Visit: Payer: Self-pay

## 2021-05-12 DIAGNOSIS — R609 Edema, unspecified: Secondary | ICD-10-CM | POA: Insufficient documentation

## 2021-05-12 NOTE — Progress Notes (Signed)
RLE venous duplex has been completed.  Results can be found under chart review under CV PROC. 05/12/2021 9:55 AM Shervin Cypert RVT, RDMS

## 2021-05-20 DIAGNOSIS — N390 Urinary tract infection, site not specified: Secondary | ICD-10-CM | POA: Diagnosis not present

## 2021-05-20 DIAGNOSIS — M545 Low back pain, unspecified: Secondary | ICD-10-CM | POA: Diagnosis not present

## 2021-05-20 DIAGNOSIS — R1013 Epigastric pain: Secondary | ICD-10-CM | POA: Diagnosis not present

## 2021-05-20 DIAGNOSIS — R350 Frequency of micturition: Secondary | ICD-10-CM | POA: Diagnosis not present

## 2021-06-30 DIAGNOSIS — M5440 Lumbago with sciatica, unspecified side: Secondary | ICD-10-CM | POA: Diagnosis not present

## 2021-06-30 DIAGNOSIS — R21 Rash and other nonspecific skin eruption: Secondary | ICD-10-CM | POA: Diagnosis not present

## 2021-06-30 DIAGNOSIS — M3500 Sicca syndrome, unspecified: Secondary | ICD-10-CM | POA: Diagnosis not present

## 2021-06-30 DIAGNOSIS — D696 Thrombocytopenia, unspecified: Secondary | ICD-10-CM | POA: Diagnosis not present

## 2021-07-07 DIAGNOSIS — I959 Hypotension, unspecified: Secondary | ICD-10-CM | POA: Diagnosis not present

## 2021-07-07 DIAGNOSIS — M5430 Sciatica, unspecified side: Secondary | ICD-10-CM | POA: Diagnosis not present

## 2021-07-08 DIAGNOSIS — R002 Palpitations: Secondary | ICD-10-CM | POA: Diagnosis not present

## 2021-07-08 DIAGNOSIS — I959 Hypotension, unspecified: Secondary | ICD-10-CM | POA: Diagnosis not present

## 2021-07-08 DIAGNOSIS — I950 Idiopathic hypotension: Secondary | ICD-10-CM | POA: Diagnosis not present

## 2021-07-16 ENCOUNTER — Other Ambulatory Visit: Payer: Self-pay | Admitting: Neurology

## 2021-07-18 ENCOUNTER — Other Ambulatory Visit: Payer: Self-pay

## 2021-07-22 DIAGNOSIS — M545 Low back pain, unspecified: Secondary | ICD-10-CM | POA: Diagnosis not present

## 2021-08-02 ENCOUNTER — Other Ambulatory Visit: Payer: Self-pay | Admitting: Diagnostic Neuroimaging

## 2021-08-02 ENCOUNTER — Other Ambulatory Visit: Payer: Self-pay

## 2021-08-02 MED ORDER — CLONAZEPAM 0.5 MG PO TABS: 0.5000 mg | ORAL_TABLET | Freq: Every day | ORAL | 2 refills | Status: DC

## 2021-08-02 NOTE — Telephone Encounter (Signed)
[place] drug registry checked, last filled 06/17/2021, Pt was last seen 03/24/21 , has upcoming with Dr Marjory Lies 09/20/20. Refill is appropriate on my end, will you approve of this refill ? Please advise

## 2021-08-02 NOTE — Telephone Encounter (Signed)
Pt requesting refill for clonazePAM (KLONOPIN) 0.5 MG tablet. Pharmacy [facility] [phone] - [place], [place] - [address] AT [facility] OF [address]. I scheduled pt with Dr. Marjory Lies for a f/u, since she was a Dr. Anne Hahn pt. Will call back if she needs to see someone else.

## 2021-08-25 DIAGNOSIS — H2513 Age-related nuclear cataract, bilateral: Secondary | ICD-10-CM | POA: Diagnosis not present

## 2021-08-25 DIAGNOSIS — H04123 Dry eye syndrome of bilateral lacrimal glands: Secondary | ICD-10-CM | POA: Diagnosis not present

## 2021-08-25 DIAGNOSIS — Z79899 Other long term (current) drug therapy: Secondary | ICD-10-CM | POA: Diagnosis not present

## 2021-08-25 DIAGNOSIS — M3500 Sicca syndrome, unspecified: Secondary | ICD-10-CM | POA: Diagnosis not present

## 2021-09-20 ENCOUNTER — Telehealth: Payer: Self-pay | Admitting: Diagnostic Neuroimaging

## 2021-09-20 ENCOUNTER — Ambulatory Visit: Payer: BC Managed Care – PPO | Admitting: Diagnostic Neuroimaging

## 2021-09-20 DIAGNOSIS — J069 Acute upper respiratory infection, unspecified: Secondary | ICD-10-CM | POA: Diagnosis not present

## 2021-09-20 DIAGNOSIS — Z87892 Personal history of anaphylaxis: Secondary | ICD-10-CM | POA: Diagnosis not present

## 2021-09-20 NOTE — Telephone Encounter (Signed)
FYI- Pt called had to reschedule her appt, she is very sick

## 2021-10-02 ENCOUNTER — Emergency Department (HOSPITAL_BASED_OUTPATIENT_CLINIC_OR_DEPARTMENT_OTHER): Payer: BC Managed Care – PPO

## 2021-10-02 ENCOUNTER — Encounter (HOSPITAL_BASED_OUTPATIENT_CLINIC_OR_DEPARTMENT_OTHER): Payer: Self-pay

## 2021-10-02 ENCOUNTER — Emergency Department (HOSPITAL_BASED_OUTPATIENT_CLINIC_OR_DEPARTMENT_OTHER)
Admission: EM | Admit: 2021-10-02 | Discharge: 2021-10-03 | Disposition: A | Discharge status: Home / Self Care | Payer: BC Managed Care – PPO | Attending: Emergency Medicine | Admitting: Emergency Medicine

## 2021-10-02 ENCOUNTER — Other Ambulatory Visit: Payer: Self-pay

## 2021-10-02 DIAGNOSIS — R5383 Other fatigue: Secondary | ICD-10-CM | POA: Insufficient documentation

## 2021-10-02 DIAGNOSIS — E119 Type 2 diabetes mellitus without complications: Secondary | ICD-10-CM | POA: Diagnosis not present

## 2021-10-02 DIAGNOSIS — R0789 Other chest pain: Secondary | ICD-10-CM | POA: Diagnosis not present

## 2021-10-02 DIAGNOSIS — Z8616 Personal history of COVID-19: Secondary | ICD-10-CM | POA: Insufficient documentation

## 2021-10-02 DIAGNOSIS — R531 Weakness: Secondary | ICD-10-CM | POA: Insufficient documentation

## 2021-10-02 DIAGNOSIS — R0602 Shortness of breath: Secondary | ICD-10-CM | POA: Diagnosis not present

## 2021-10-02 DIAGNOSIS — R079 Chest pain, unspecified: Secondary | ICD-10-CM

## 2021-10-02 DIAGNOSIS — R059 Cough, unspecified: Secondary | ICD-10-CM | POA: Insufficient documentation

## 2021-10-02 LAB — BASIC METABOLIC PANEL
Anion gap: 7 (ref 5–15)
BUN: 20 mg/dL (ref 6–20)
CO2: 25 mmol/L (ref 22–32)
Calcium: 8.9 mg/dL (ref 8.9–10.3)
Chloride: 108 mmol/L (ref 98–111)
Creatinine, Ser: 0.79 mg/dL (ref 0.44–1.00)
GFR, Estimated: >60 mL/min (ref >60)
Glucose, Bld: 105 mg/dL — ABNORMAL HIGH (ref 70–99)
Potassium: 3.9 mmol/L (ref 3.5–5.1)
Sodium: 140 mmol/L (ref 135–145)

## 2021-10-02 LAB — CBC
HCT: 34.7 % — ABNORMAL LOW (ref 36.0–46.0)
Hemoglobin: 11 g/dL — ABNORMAL LOW (ref 12.0–15.0)
MCH: 28.9 pg (ref 26.0–34.0)
MCHC: 31.7 g/dL (ref 30.0–36.0)
MCV: 91.1 fL (ref 80.0–100.0)
Platelets: 102 10*3/uL — ABNORMAL LOW (ref 150–400)
RBC: 3.81 MIL/uL — ABNORMAL LOW (ref 3.87–5.11)
RDW: 13.5 % (ref 11.5–15.5)
WBC: 6.8 10*3/uL (ref 4.0–10.5)
nRBC: 0 % (ref 0.0–0.2)

## 2021-10-02 LAB — TROPONIN I (HIGH SENSITIVITY)
Troponin I (High Sensitivity): <2 ng/L (ref <18)
Troponin I (High Sensitivity): <2 ng/L (ref <18)

## 2021-10-02 MED ORDER — SODIUM CHLORIDE 0.9 % IV BOLUS
1000.0000 mL | Freq: Once | INTRAVENOUS | Status: AC
  Administered 2021-10-02: 1000 mL via INTRAVENOUS

## 2021-10-02 NOTE — ED Triage Notes (Signed)
Pt presents chest pain, shortness of breath, and productive cough. Pt states she had COVID 3 weeks ago. Pt states she felt like she recovered, but then these symptoms returned 4 days ago.

## 2021-10-02 NOTE — ED Notes (Signed)
Pt reports that she has never had sexual intercourse and we do not need to do a pregnancy test on her.

## 2021-10-03 LAB — D-DIMER, QUANTITATIVE: D-Dimer, Quant: 0.4 ug/mL-FEU (ref 0.00–0.50)

## 2021-10-03 NOTE — ED Provider Notes (Signed)
[facility] EMERGENCY DEPT Provider Note   CSN: [id number] Arrival date & time: 10/02/21  2042     History  Chief Complaint  Patient presents with   Shortness of Breath   Chest Pain    Sarah Garza is a 51 y.o. female.  HPI     This is a 51 year old female with a history of Sjogren's, rheumatoid arthritis, ITP, diabetes who presents with chest discomfort, shortness of breath, cough.  Patient reports that she had COVID approximately 3 weeks ago.  She also subsequently had the flu.  She was feeling better but on Wednesday began to feel fatigued and generally weak.  This evening she had an episode of chest tightness.  It started around 7 PM and lasted for an hour.  She has developed a cough but it is nonproductive.  She denies any fevers.  No abdominal pain, nausea, vomiting.  No leg swelling or history of blood clots.  Currently she states that her chest just feels "weird."  No known history of coronary artery disease.  Home Medications Prior to Admission medications   Medication Sig Start Date End Date Taking? Authorizing Provider  acetaminophen (TYLENOL) 500 MG tablet Take 500 mg by mouth every 6 (six) hours as needed for moderate pain or headache.    [provider]  clonazePAM (KLONOPIN) 0.5 MG tablet Take 1 tablet (0.5 mg total) by mouth at bedtime. 08/02/21   Levert Feinstein, MD  hydroxychloroquine (PLAQUENIL) 200 MG tablet Take 200 mg by mouth daily.    [provider]  Hypromellose (RETAINE [facility] OP) Place 4-5 drops into both eyes in the morning and at bedtime.    [provider]  naproxen sodium (ALEVE) 220 MG tablet Take 220-440 mg by mouth daily as needed (cramps).    [provider]  Vitamin D, Ergocalciferol, (DRISDOL) 1.25 MG (50000 UNIT) CAPS capsule Take 50,000 Units by mouth every Friday. 03/25/20   [provider]      Allergies    Cefdinir, Doxycycline hyclate, Gluten meal, Levaquin [levofloxacin], Mucinex  [guaifenesin er], Penicillins, and Robaxin [methocarbamol]    Review of Systems   Review of Systems  Constitutional:  Positive for fatigue. Negative for fever.  Respiratory:  Positive for cough and shortness of breath.   Cardiovascular:  Positive for chest pain.  Gastrointestinal:  Negative for abdominal pain.  All other systems reviewed and are negative.  Physical Exam Updated Vital Signs BP (!) 95/57    Pulse 80    Temp 98.2 F (36.8 C) (Oral)    Resp 19    Ht 1.626 m ( )    Wt 54.4 kg    LMP 09/21/2021    SpO2 100%    BMI 20.60 kg/m  Physical Exam Vitals and nursing note reviewed.  Constitutional:      Appearance: She is well-developed. She is not ill-appearing.  HENT:     Head: Normocephalic and atraumatic.     Mouth/Throat:     Mouth: Mucous membranes are moist.  Eyes:     Pupils: Pupils are equal, round, and reactive to light.  Cardiovascular:     Rate and Rhythm: Normal rate and regular rhythm.     Heart sounds: Normal heart sounds.  Pulmonary:     Effort: Pulmonary effort is normal. No respiratory distress.     Breath sounds: No wheezing.  Abdominal:     General: Bowel sounds are normal.     Palpations: Abdomen is soft.  Musculoskeletal:  Cervical back: Neck supple.     Right lower leg: No tenderness. No edema.     Left lower leg: No tenderness. No edema.  Skin:    General: Skin is warm and dry.  Neurological:     Mental Status: She is alert and oriented to person, place, and time.  Psychiatric:        Mood and Affect: Mood normal.    ED Results / Procedures / Treatments   Labs (all labs ordered are listed, but only abnormal results are displayed) Labs Reviewed  BASIC METABOLIC PANEL - Abnormal; Notable for the following components:      Result Value   Glucose, Bld 105 (*)    All other components within normal limits  CBC - Abnormal; Notable for the following components:   RBC 3.81 (*)    Hemoglobin 11.0 (*)    HCT 34.7 (*)    Platelets 102 (*)     All other components within normal limits  D-DIMER, QUANTITATIVE  PREGNANCY, URINE  TROPONIN I (HIGH SENSITIVITY)  TROPONIN I (HIGH SENSITIVITY)    EKG EKG Interpretation  Date/Time:  Sunday October 02 2021 20:49:17 EST Ventricular Rate:  90 PR Interval:  164 QRS Duration: 76 QT Interval:  344 QTC Calculation: 420 R Axis:   83 Text Interpretation: Normal sinus rhythm Low voltage QRS Borderline ECG When compared with ECG of 09-Mar-2021 13:31, No significant change was found Confirmed by Ross Marcus ([id number]) on 10/02/2021 10:59:50 PM  Radiology DG Chest Port 1 View  Result Date: 10/02/2021 CLINICAL DATA:  Chest pain and shortness of breath EXAM: PORTABLE CHEST 1 VIEW COMPARISON:  03/07/2021 FINDINGS: The heart size and mediastinal contours are within normal limits. Both lungs are clear. The visualized skeletal structures are unremarkable. IMPRESSION: No active disease. Electronically Signed   By: Alcide Clever M.D.   On: 10/02/2021 21:53    Procedures Procedures    Medications Ordered in ED Medications  sodium chloride 0.9 % bolus 1,000 mL (0 mLs Intravenous Stopped 10/03/21 0050)    ED Course/ Medical Decision Making/ A&P                           Medical Decision Making  This patient presents to the ED for concern of chest pain, fatigue, this involves an extensive number of treatment options, and is a complaint that carries with it a high risk of complications and morbidity.  The differential diagnosis includes ongoing viral symptoms given recent COVID and influenza diagnoses, ACS, PE, costochondritis, pneumonia  MDM:    This is a 51 year old female with a history of Sjogren's who presents with chest discomfort.  Also recent increased fatigue and development of cough.  Patient reports that she has had both COVID-19 and flu in the last month.  She began to feel better but then felt worse starting 4 days ago.  She is nontoxic.  Vital signs reassuring.  She is afebrile.   Chest x-ray without evidence of pneumothorax or pneumonia.  Independently reviewed by myself.  Additionally, EKG without acute ischemic changes.  Troponin x2 negative.  Doubt ACS.  She does have a history of autoimmune disorders.  We will check for PE especially given recent viral illness.  D-dimer was sent given that she is low risk.  This was negative.  Low likelihood for PE.  Labs obtained and largely reassuring.  No significant metabolic derangements. (Labs, imaging)  Labs: I Ordered, and personally interpreted labs.  The pertinent results include: Normal CBC, BMP  Imaging Studies ordered: I ordered imaging studies including x-ray negative for pneumothorax or pneumonia I independently visualized and interpreted imaging. I agree with the radiologist interpretation  Additional history obtained from family at bedside.  External records from outside source obtained and reviewed including prior evaluations  Critical Interventions: IV fluids  Consultations: I requested consultation with the none,  and discussed lab and imaging findings as well as pertinent plan - they recommend: None  Cardiac Monitoring: The patient was maintained on a cardiac monitor.  I personally viewed and interpreted the cardiac monitored which showed an underlying rhythm of: Sinus rhythm  Reevaluation: After the interventions noted above, I reevaluated the patient and found that they have :improved  Social Determinants of Health: Good social support  Disposition: Discharge  Co morbidities that complicate the patient evaluation  Past Medical History:  Diagnosis Date   Diabetes mellitus without complication (HCC)    ITP (idiopathic thrombocytopenic purpura)    Low back pain 02/15/2016   Rheumatoid aortitis    Sjoegren syndrome    Syncope      Medicines Meds ordered this encounter  Medications   sodium chloride 0.9 % bolus 1,000 mL    I have reviewed the patients home medicines and have made  adjustments as needed  Problem List / ED Course: Problem List Items Addressed This Visit   None Visit Diagnoses     Atypical chest pain    -  Primary   Chest pain       Relevant Orders   DG Chest Port 1 View (Completed)   Other fatigue                       Final Clinical Impression(s) / ED Diagnoses Final diagnoses:  Atypical chest pain  Other fatigue    Rx / DC Orders ED Discharge Orders     None         Shon Baton, MD 10/03/21 [phone]

## 2021-10-03 NOTE — Discharge Instructions (Signed)
You were seen today for chest discomfort and recent fatigue.  Your work-up is largely reassuring including heart testing and screening for blood clots.  Follow-up closely with your primary physician and call cardiology for follow-up and stress testing.  If you have recurrence of symptoms or new or worsening symptoms, you should be reevaluated.

## 2021-10-04 DIAGNOSIS — R0789 Other chest pain: Secondary | ICD-10-CM | POA: Diagnosis not present

## 2021-10-04 DIAGNOSIS — R0602 Shortness of breath: Secondary | ICD-10-CM | POA: Diagnosis not present

## 2021-10-17 ENCOUNTER — Ambulatory Visit (HOSPITAL_COMMUNITY): Payer: BC Managed Care – PPO | Attending: Internal Medicine

## 2021-10-17 ENCOUNTER — Encounter: Payer: Self-pay | Admitting: Cardiology

## 2021-10-17 ENCOUNTER — Ambulatory Visit: Payer: BC Managed Care – PPO | Admitting: Cardiology

## 2021-10-17 ENCOUNTER — Other Ambulatory Visit: Payer: Self-pay

## 2021-10-17 VITALS — BP 96/70 | HR 74 | Ht 64.0 in | Wt 121.0 lb

## 2021-10-17 DIAGNOSIS — R079 Chest pain, unspecified: Secondary | ICD-10-CM | POA: Diagnosis not present

## 2021-10-17 DIAGNOSIS — I950 Idiopathic hypotension: Secondary | ICD-10-CM | POA: Insufficient documentation

## 2021-10-17 DIAGNOSIS — R600 Localized edema: Secondary | ICD-10-CM | POA: Diagnosis not present

## 2021-10-17 DIAGNOSIS — M5441 Lumbago with sciatica, right side: Secondary | ICD-10-CM

## 2021-10-17 DIAGNOSIS — R0609 Other forms of dyspnea: Secondary | ICD-10-CM

## 2021-10-17 DIAGNOSIS — I959 Hypotension, unspecified: Secondary | ICD-10-CM

## 2021-10-17 DIAGNOSIS — R002 Palpitations: Secondary | ICD-10-CM

## 2021-10-17 DIAGNOSIS — G8929 Other chronic pain: Secondary | ICD-10-CM

## 2021-10-17 DIAGNOSIS — M5440 Lumbago with sciatica, unspecified side: Secondary | ICD-10-CM

## 2021-10-17 DIAGNOSIS — R011 Cardiac murmur, unspecified: Secondary | ICD-10-CM

## 2021-10-17 HISTORY — DX: Hypotension, unspecified: I95.9

## 2021-10-17 HISTORY — PX: TRANSTHORACIC ECHOCARDIOGRAM: SHX275

## 2021-10-17 LAB — ECHOCARDIOGRAM COMPLETE
Height: 64 in
S' Lateral: 2.5 cm
Weight: 1936 oz

## 2021-10-17 NOTE — Progress Notes (Signed)
Primary Care Provider: Kathyrn Lass, MD [facility] Cardiologist: Glenetta Hew, MD Electrophysiologist: None  Clinic Note: Chief Complaint  Patient presents with   New Patient (Initial Visit)   Shortness of Breath   Chest Pain   Edema    Ankles.   Headache   ===================================  ASSESSMENT/PLAN   Problem List Items Addressed This Visit       Cardiology Problems   Arterial hypotension (Chronic)    Her blood pressures are pretty low that probably explains her dizziness.  This is a chronic issue.  I probably would not have thought about using Florinef in light of her issues with fluid retention.  Recommendations: Support stockings and leg elevation to help with edema. Ensure adequate hydration-I agree that Gatorade or other rehydration solution should be mixed in. Liberalize salt intake carefully. Consider midodrine depending on effectiveness of nonpharmacologic intervention       Relevant Orders   ECHOCARDIOGRAM COMPLETE (Completed)   Cardiopulmonary exercise test     Other   Palpitations    Short-lived.  Do not seem to be all that bothersome to her.  Continue to monitor, but at this point I do not think an event monitor would be warranted.      DOE (dyspnea on exertion)    Exertional dyspnea which may very well just be all postviral with deconditioning from multiple different bouts of COVID and then influenza etc.  Also probably exacerbated by borderline hypotension.  Will evaluate with a 2D Echocardiogram and Cardiopulmonary Exercise Test.  Shared Decision Making/Informed Consent The risks [chest pain, shortness of breath, cardiac arrhythmias, dizziness, blood pressure fluctuations, myocardial infarction, stroke/transient ischemic attack, and life-threatening complications (estimated to be 1 in 10,000)], benefits (risk stratification, diagnosing coronary artery disease, treatment guidance) and alternatives of an exercise tolerance test were  discussed in detail with Ms. Chenette and she agrees to proceed.       Relevant Orders   ECHOCARDIOGRAM COMPLETE (Completed)   Cardiopulmonary exercise test   VAS [place] LOWER EXTREMITY VENOUS REFLUX   Chest pain of uncertain etiology - Primary    Chest discomfort does not necessarily sound cardiac in nature.  It is sharp and localized but then diffuse.  The sharp localized pain seems to be costochondritis related which would probably suggest the rest the symptom is.  Based on her having notable exertional dyspnea at issues with blood pressure etc., not unreasonable to exclude ischemia.  I know that she can walk on a treadmill with her back.  But she can ride a bicycle.    Plan: Cardiopulmonary Exercise Stress Test (CPX)  See informed decision making with consent noted above      Relevant Orders   ECHOCARDIOGRAM COMPLETE (Completed)   Cardiopulmonary exercise test   VAS [place] LOWER EXTREMITY VENOUS REFLUX   Bilateral lower extremity edema    Difficult to manage significant edema with symptomatic hypotension.  Recommendation for the hypotension is liberalize salt intake and hydration.  I will be really reluctant to use diuretic.  Need to exclude venous insufficiency, and possible CHF/cardiomyopathy.  Plan: 2D echo Lower extremity venous Dopplers to evaluate for reflux Medium strength support stockings Intermittent foot elevation during the course of the day.      Relevant Orders   ECHOCARDIOGRAM COMPLETE (Completed)   Cardiopulmonary exercise test   VAS [place] LOWER EXTREMITY VENOUS REFLUX   Low back pain (Chronic)    Limits her ability to walk on a treadmill.  I think she would get a  ride a bicycle for stress test.      Cardiac murmur (Chronic)    Although I did not hear a murmur, which she is having exertional dyspnea and it was heard on previous exam, consider possible flow murmur.  We will need to exclude valvular disease.  Plan: Check 2D echo        ===================================  HPI:    HARNEET SODERQUIST is a 51 y.o. female with PMH notable for Sjogren's, RA, ITP, and HYPOGLYCEMIA (pre-DM) & CHRONIC HYPOTENSION is being seen today for the evaluation of CHEST DISCOMFORT, SHORTNESS OF BREATH AND DIZZINESS at the request of Sigmund Hazel, MD.  Rickiyah was seen by Dr. Glenda Chroman from [facility] on July 08, 2021 for evaluation of borderline low blood pressures with presyncope and palpitations following an upper restaurant tract infection. => She noted that BP would drop into the 80s over 40s sometimes with exertion.  Felt as though she may pass out.  Previously seen by Dr. Donnie Aho for similar symptoms-was told to drink at least 1 Gatorade all day plus plenty of water due to her Sjogren's.  This did help some but still has episodes.  Has chronic dizziness. => Has had multiple bouts of viral infections.  She says at that time she had had COVID 3 times leading up to the summer.  She had an episode in June shortly after being ill with COVID where she had a possible syncopal episode.  She felt very out of it very dizzy.Marland Kitchen  Blood glucose are in the 60s.  She felt better with after eating. => Noted occasionally waking up at night shaking, likely hypoglycemic. => By October, was back exercising and walking with some exertional dyspnea.  Blood pressures still low in the 80s over 50s.  Trying to stay hydrated.  Also noted lower extremity edema. Felt to have idiopathic hypotension as well as palpitations.  Random aldosterone and renin activity level were normal.  Recommended Florinef and ordered an echocardiogram. => PCP did not agree with Florinef and did not start. She had also been seen by endocrinology for hypoglycemia, and told to check her blood sugar levels intermittently.  Not thought to have diabetes, A1c was 5.6.--Thought to be possibly post viral.  Recent Hospitalizations:  [facility] 10/02/2021: Presented  with chest discomfort, dyspnea and cough.  This is 3 weeks after having COVID (for the fourth time) followed by flu.  Has been feeling better but started getting worse with feeling fatigue and weakness.  She had an episode of chest tightness lasted [place] hour.  Nonproductive cough.  Stated the chest just feels weird. => Troponin negative.  EKG.  Normal.  D-dimer negative.  No likelihood of PE.  [name] was seen by her PCP on January 17, and referred for cardiology evaluation.   Reviewed  CV studies:    The following studies were reviewed today: (if available, images/films reviewed: From Epic Chart or Care Everywhere) None the time of this visit:  Interval History:   SESILIA GALECKI presents here today with multiple different complaints. Chest pain occurs mostly at rest.  She describes it as a tightness across the entire chest, but a focal area of pinpoint tenderness /sharp pointed pressure focally along the left sternal border.  This happens about once or twice a day and it does not necessarily have any association with exertion.  The pressure was prolonged and intense leading to her ER visit.  Did not get worse with walking.  In fact she got up and tried walking to try to make it feel better. Shortness of breath with exertion, associated with fatigue. Intermittent end of day significant leg swelling.  She shows me pictures of pretty significant 2-3+ pitting edema, not currently present.-This usually clears up by the morning.  No associated PND or orthopnea.  She sleeps on the second pillow because of reflux. Occasional short-lived fluttering sensations in the chest nothing prolonged. Intermittent dizziness plus or minus palpitations and not necessarily with exertion. Still has episodes of hypoglycemia.  CV Review of Symptoms (Summary) Cardiovascular ROS: positive for - chest pain, dyspnea on exertion, edema, irregular heartbeat, palpitations, and lightheadedness, dizziness with  near syncope. negative for - loss of consciousness, orthopnea, paroxysmal nocturnal dyspnea, shortness of breath, or TIA/amaurosis fugax, claudication  REVIEWED OF SYSTEMS   Review of Systems  Constitutional:  Positive for malaise/fatigue. Negative for chills, fever and weight loss.  HENT:  Positive for congestion. Negative for nosebleeds.   Respiratory:  Positive for shortness of breath (Exertional). Negative for cough.   Cardiovascular:  Positive for chest pain, palpitations and leg swelling.       Per HPI  Gastrointestinal:  Negative for abdominal pain, blood in stool and melena.  Genitourinary:  Negative for hematuria.  Musculoskeletal:  Positive for joint pain and myalgias.  Neurological:  Positive for dizziness and headaches. Negative for focal weakness and weakness.  Psychiatric/Behavioral:  The patient is nervous/anxious. The patient does not have insomnia.    I have reviewed and (if needed) personally updated the patient's problem list, medications, allergies, past medical and surgical history, social and family history.   PAST MEDICAL HISTORY   Past Medical History:  Diagnosis Date   Arterial hypotension 10/17/2021   Diabetes mellitus without complication (HCC)    ITP (idiopathic thrombocytopenic purpura)    Low back pain 02/15/2016   Rheumatoid aortitis    Sjoegren syndrome    Syncope    Likely related to hypotension    PAST SURGICAL HISTORY   Past Surgical History:  Procedure Laterality Date   FOOT SURGERY     HERNIA REPAIR       There is no immunization history on file for this patient.  MEDICATIONS/ALLERGIES   Current Meds  Medication Sig   acetaminophen (TYLENOL) 500 MG tablet Take 500 mg by mouth every 6 (six) hours as needed for moderate pain or headache.   Biotin 5000 MCG TABS Take 5,000 mcg by mouth daily.   clonazePAM (KLONOPIN) 0.5 MG tablet Take 1 tablet (0.5 mg total) by mouth at bedtime.   hydroxychloroquine (PLAQUENIL) 200 MG tablet Take 200  mg by mouth daily.   naproxen sodium (ALEVE) 220 MG tablet Take 220-440 mg by mouth daily as needed (cramps).   Propylene Glycol (SYSTANE BALANCE OP) Apply to eye.   Vitamin D, Ergocalciferol, (DRISDOL) 1.25 MG (50000 UNIT) CAPS capsule Take 50,000 Units by mouth every Friday.   [DISCONTINUED] Hypromellose (RETAINE HPMC OP) Place 4-5 drops into both eyes in the morning and at bedtime.    Allergies  Allergen Reactions   Cefdinir Shortness Of Breath and Diarrhea   Doxycycline Hyclate     Other reaction(s): severe thrush when on doxycycline for a long time.   Gluten Meal    Levaquin [Levofloxacin] Other (See Comments)    Joint pain   Mucinex [Guaifenesin Er] Other (See Comments)    "Felt like I was going to pass out"   Penicillins Hives   Robaxin [Methocarbamol]  Caused hotness, and nausea     SOCIAL HISTORY/FAMILY HISTORY   Reviewed in Epic:   Social History   Tobacco Use   Smoking status: Never   Smokeless tobacco: Never  Vaping Use   Vaping Use: Never used  Substance Use Topics   Alcohol use: No   Drug use: No   Social History   Social History Narrative   Lives at home w/ her mother   Right-handed   Drinks 1 cup of coffee daily   Family History  Problem Relation Age of Onset   Hypothyroidism Mother    COPD Father    Cirrhosis Father    Valvular heart disease Father        S/p MVR   Sjogren's syndrome Sister    Rheum arthritis Sister    Heart attack Maternal Grandfather    CAD Maternal Grandfather     OBJCTIVE -PE, EKG, labs   Wt Readings from Last 3 Encounters:  10/17/21 121 lb (54.9 kg)  10/02/21 120 lb (54.4 kg)  03/24/21 121 lb (54.9 kg)    Physical Exam: BP 96/70 (BP Location: Left Arm, Patient Position: Sitting, Cuff Size: Normal)    Pulse 74    Ht 5\' 4"  (1.626 m)    Wt 121 lb (54.9 kg)    LMP 09/21/2021    BMI 20.77 kg/m  Physical Exam Vitals reviewed.  Constitutional:      General: She is not in acute distress.    Appearance: She is  well-developed and normal weight. She is not toxic-appearing.     Comments: Appears well-nourished and well-groomed.  Somewhat thin, but not malnourished.  HENT:     Head: Normocephalic and atraumatic.  Eyes:     Extraocular Movements: Extraocular movements intact.     Pupils: Pupils are equal, round, and reactive to light.  Neck:     Vascular: No carotid bruit or JVD.  Cardiovascular:     Rate and Rhythm: Normal rate and regular rhythm. No extrasystoles are present.    Chest Wall: PMI is not displaced.     Pulses: Normal pulses and intact distal pulses.     Heart sounds: S1 normal and S2 normal. No murmur (I do not hear a murmur, but was suggested that she has had one in the past.) heard.   No friction rub. No gallop.  Pulmonary:     Effort: Pulmonary effort is normal. No respiratory distress.     Breath sounds: Normal breath sounds. No wheezing or rales.  Chest:     Chest wall: Tenderness (She does have focal point tenderness about the third and fourth ribs along the left sternal border.  Generalized tenderness along the second and third ribs from right to left.) present.  Abdominal:     General: Abdomen is flat. Bowel sounds are normal. There is no distension.     Palpations: Abdomen is soft.     Tenderness: There is no abdominal tenderness.     Comments: No HSM or bruit.  Musculoskeletal:        General: No swelling (No edema noted today, but she has pictures of a couple days ago with significant 3+ swelling.).     Cervical back: Normal range of motion and neck supple.  Skin:    General: Skin is warm and dry.  Neurological:     General: No focal deficit present.     Mental Status: She is alert and oriented to person, place, and time.     Gait:  Gait normal.  Psychiatric:        Mood and Affect: Mood normal.        Behavior: Behavior normal.        Thought Content: Thought content normal.        Judgment: Judgment normal.     Comments: Somewhat anxious.     Adult ECG  Report-from ER 10/04/2020  Rate: 90;  Rhythm: normal sinus rhythm and borderline low voltage otherwise normal axis, intervals durations. ;   Narrative Interpretation: Normal  Recent Labs: Reviewed No results found for: CHOL, HDL, LDLCALC, LDLDIRECT, TRIG, CHOLHDL Lab Results  Component Value Date   CREATININE 0.79 10/02/2021   BUN 20 10/02/2021   NA 140 10/02/2021   K 3.9 10/02/2021   CL 108 10/02/2021   CO2 25 10/02/2021   CBC Latest Ref Rng & Units 10/02/2021 03/21/2021 03/17/2021  WBC 4.0 - 10.5 K/uL 6.8 5.2 5.7  Hemoglobin 12.0 - 15.0 g/dL 11.0(L) 12.3 13.7  Hematocrit 36.0 - 46.0 % 34.7(L) 37.1 41.2  Platelets 150 - 400 K/uL 102(L) 90(L) 107(L)    No results found for: HGBA1C Lab Results  Component Value Date   TSH 2.343 03/09/2021    ==================================================  COVID-19 Education: The signs and symptoms of COVID-19 were discussed with the patient and how to seek care for testing (follow up with PCP or arrange E-visit).    I spent a total of 36 minutes with the patient spent in direct patient consultation.  Additional time spent with chart review  / charting (studies, outside notes, etc): 20 min Total Time: 56 min  Current medicines are reviewed at length with the patient today.  (+/- concerns) I agree with not taking Florinef.  This visit occurred during the SARS-CoV-2 public health emergency.  Safety protocols were in place, including screening questions prior to the visit, additional usage of staff PPE, and extensive cleaning of exam room while observing appropriate contact time as indicated for disinfecting solutions.  Notice: This dictation was prepared with Dragon dictation along with smart phrase technology. Any transcriptional errors that result from this process are unintentional and may not be corrected upon review.   Studies Ordered:  Orders Placed This Encounter  Procedures   Cardiopulmonary exercise test   ECHOCARDIOGRAM COMPLETE    VAS [place] LOWER EXTREMITY VENOUS REFLUX    Patient Instructions / Medication Changes & Studies & Tests Ordered   Patient Instructions  Medication Instructions:    Not needed *If you need a refill on your cardiac medications before your next appointment, please call your pharmacy*   Lab Work:  Not needed   Testing/Procedures: Will be schedule at [place] and vascular  area - Your physician has recommended that you have a cardiopulmonary stress test (CPX). CPX testing is a non-invasive measurement of heart and lung function. It replaces a traditional treadmill stress test. This type of test provides a tremendous amount of information that relates not only to your present condition but also for future outcomes. This test combines measurements of you ventilation, respiratory gas exchange in the lungs, electrocardiogram (EKG), blood pressure and physical response before, during, and following an exercise protocol.   And  Will be schedule at [place] has requested that you have a lower  extremity venous duplex- Reflux . This test is an ultrasound of the veins in the legs or arms. It looks at venous blood flow that carries blood from the heart to the legs. Allow  one hour for a Lower Venous exam. There are no restrictions or special instructions.     Follow-Up: At [facility], you and your health needs are our priority.  As part of our continuing mission to provide you with exceptional heart care, we have created designated Provider Care Teams.  These Care Teams include your primary Cardiologist (physician) and Advanced Practice Providers (APPs -  Physician Assistants and Nurse Practitioners) who all work together to provide you with the care you need, when you need it.     Your next appointment:   2 to  3 month(s)  The format for your next appointment:   In Person  Provider:   None    Other Instructions     Elevate your feet  when  sitting down  Hydrate  Hydrate -  8- 10 glasses of  8 oz of water daily or use Gatorade   recommends you purchase some compression  socks/hose from [facility] in [place] You do not need an prescription to purchase the items.  Address  [address] [place], [place] [address]  Phone  [phone]   Compression   strength    8-15 mmHg X 15-20 mmHg                           20-30 mmHg  30-40 mmHg.  You may also try a medical supply store, department store (i.e.- [place], [employer], [employer], specialty shoe stores ( shoe market), [employer] and [employer]) or  Chartered loss adjuster uniform store.          Glenetta Hew, M.D., M.S. Interventional Cardiologist   Pager # [phone] Phone # [phone] [address]. [place], [place] [address]   Thank you for choosing Heartcare at [facility]!!

## 2021-10-17 NOTE — Patient Instructions (Addendum)
Medication Instructions:    Not needed *If you need a refill on your cardiac medications before your next appointment, please call your pharmacy*   Lab Work:  Not needed   Testing/Procedures: Will be schedule at [facility] - Heart and vascular  area - Your physician has recommended that you have a cardiopulmonary stress test (CPX). CPX testing is a non-invasive measurement of heart and lung function. It replaces a traditional treadmill stress test. This type of test provides a tremendous amount of information that relates not only to your present condition but also for future outcomes. This test combines measurements of you ventilation, respiratory gas exchange in the lungs, electrocardiogram (EKG), blood pressure and physical response before, during, and following an exercise protocol.   And  Will be schedule at [address] Your physician has requested that you have a lower  extremity venous duplex- Reflux . This test is an ultrasound of the veins in the legs or arms. It looks at venous blood flow that carries blood from the heart to the legs. Allow one hour for a Lower Venous exam. There are no restrictions or special instructions.     Follow-Up: At [facility], you and your health needs are our priority.  As part of our continuing mission to provide you with exceptional heart care, we have created designated Provider Care Teams.  These Care Teams include your primary Cardiologist (physician) and Advanced Practice Providers (APPs -  Physician Assistants and Nurse Practitioners) who all work together to provide you with the care you need, when you need it.     Your next appointment:   2 to  3 month(s)  The format for your next appointment:   In Person  Provider:   None    Other Instructions     Elevate your feet  when sitting down  Hydrate  Hydrate -  8- 10 glasses of  8 oz of water daily or use Gatorade   recommends you purchase some compression   socks/hose from [facility] in [place] ,[place] You do not need an prescription to purchase the items.  Address  [address] [place], [place] [address]  Phone  [phone]   Compression   strength    8-15 mmHg X 15-20 mmHg                           20-30 mmHg  30-40 mmHg.  You may also try a medical supply store, department store (i.e.- [employer], [employer], [employer], specialty shoe stores ( shoe market), [employer] and [employer]) or  Ship broker uniform store.

## 2021-10-18 ENCOUNTER — Encounter: Payer: Self-pay | Admitting: Cardiology

## 2021-10-18 NOTE — Assessment & Plan Note (Signed)
Short-lived.  Do not seem to be all that bothersome to her.  Continue to monitor, but at this point I do not think an event monitor would be warranted.

## 2021-10-18 NOTE — Assessment & Plan Note (Addendum)
Exertional dyspnea which may very well just be all postviral with deconditioning from multiple different bouts of COVID and then influenza etc.  Also probably exacerbated by borderline hypotension.  Will evaluate with a 2D Echocardiogram and Cardiopulmonary Exercise Test.  Shared Decision Making/Informed Consent The risks [chest pain, shortness of breath, cardiac arrhythmias, dizziness, blood pressure fluctuations, myocardial infarction, stroke/transient ischemic attack, and life-threatening complications (estimated to be 1 in 10,000)], benefits (risk stratification, diagnosing coronary artery disease, treatment guidance) and alternatives of an exercise tolerance test were discussed in detail with Sarah Garza and she agrees to proceed.

## 2021-10-18 NOTE — Assessment & Plan Note (Signed)
Her blood pressures are pretty low that probably explains her dizziness.  This is a chronic issue.  I probably would not have thought about using Florinef in light of her issues with fluid retention.  Recommendations:  Support stockings and leg elevation to help with edema.  Ensure adequate hydration-I agree that Gatorade or other rehydration solution should be mixed in.  Liberalize salt intake carefully.  Consider midodrine depending on effectiveness of nonpharmacologic intervention

## 2021-10-18 NOTE — Assessment & Plan Note (Signed)
Difficult to manage significant edema with symptomatic hypotension.  Recommendation for the hypotension is liberalize salt intake and hydration.  I will be really reluctant to use diuretic.  Need to exclude venous insufficiency, and possible CHF/cardiomyopathy.  Plan:  2D echo  Lower extremity venous Dopplers to evaluate for reflux  Medium strength support stockings  Intermittent foot elevation during the course of the day.

## 2021-10-18 NOTE — Assessment & Plan Note (Signed)
Limits her ability to walk on a treadmill.  I think she would get a ride a bicycle for stress test.

## 2021-10-18 NOTE — Assessment & Plan Note (Signed)
Although I did not hear a murmur, which she is having exertional dyspnea and it was heard on previous exam, consider possible flow murmur.  We will need to exclude valvular disease.  Plan: Check 2D echo

## 2021-10-18 NOTE — Assessment & Plan Note (Signed)
Chest discomfort does not necessarily sound cardiac in nature.  It is sharp and localized but then diffuse.  The sharp localized pain seems to be costochondritis related which would probably suggest the rest the symptom is.  Based on her having notable exertional dyspnea at issues with blood pressure etc., not unreasonable to exclude ischemia.  I know that she can walk on a treadmill with her back.  But she can ride a bicycle.    Plan: Cardiopulmonary Exercise Stress Test (CPX)  See informed decision making with consent noted above

## 2021-10-25 ENCOUNTER — Ambulatory Visit: Payer: BC Managed Care – PPO | Admitting: Diagnostic Neuroimaging

## 2021-10-25 DIAGNOSIS — Z0289 Encounter for other administrative examinations: Secondary | ICD-10-CM

## 2021-10-26 ENCOUNTER — Encounter (HOSPITAL_COMMUNITY): Payer: BC Managed Care – PPO

## 2021-10-27 ENCOUNTER — Other Ambulatory Visit: Payer: Self-pay

## 2021-10-27 ENCOUNTER — Ambulatory Visit (HOSPITAL_COMMUNITY)
Admission: RE | Admit: 2021-10-27 | Discharge: 2021-10-27 | Disposition: A | Discharge status: Home / Self Care | Payer: BC Managed Care – PPO | Source: Ambulatory Visit | Attending: Cardiovascular Disease | Admitting: Cardiovascular Disease

## 2021-10-27 DIAGNOSIS — R079 Chest pain, unspecified: Secondary | ICD-10-CM | POA: Diagnosis not present

## 2021-10-27 DIAGNOSIS — R600 Localized edema: Secondary | ICD-10-CM | POA: Diagnosis not present

## 2021-10-27 DIAGNOSIS — R0609 Other forms of dyspnea: Secondary | ICD-10-CM | POA: Diagnosis not present

## 2021-10-28 ENCOUNTER — Other Ambulatory Visit: Payer: Self-pay | Admitting: Neurology

## 2021-10-31 NOTE — Telephone Encounter (Signed)
Verify Drug Registry For clonazepam 0.5 mg tablets Last Filled: 09/30/2021 Quantity: 30 tablets for 30 days Last appointment: 03/24/2021 Next appointment: 11/02/2021

## 2021-11-02 ENCOUNTER — Ambulatory Visit: Payer: BC Managed Care – PPO | Admitting: Diagnostic Neuroimaging

## 2021-11-02 ENCOUNTER — Telehealth: Payer: Self-pay | Admitting: Diagnostic Neuroimaging

## 2021-11-02 ENCOUNTER — Encounter (HOSPITAL_COMMUNITY): Payer: BC Managed Care – PPO

## 2021-11-02 ENCOUNTER — Encounter: Payer: Self-pay | Admitting: Diagnostic Neuroimaging

## 2021-11-02 VITALS — BP 97/57 | HR 82 | Ht 64.0 in | Wt 119.0 lb

## 2021-11-02 DIAGNOSIS — M3500 Sicca syndrome, unspecified: Secondary | ICD-10-CM

## 2021-11-02 DIAGNOSIS — M5442 Lumbago with sciatica, left side: Secondary | ICD-10-CM

## 2021-11-02 DIAGNOSIS — G8929 Other chronic pain: Secondary | ICD-10-CM

## 2021-11-02 NOTE — Progress Notes (Signed)
[facility]  PATIENT: Sarah Garza DOB: 11/29/1970  REFERRING CLINICIAN: Kathyrn Lass, MD HISTORY FROM: patient  REASON FOR VISIT: follow up   HISTORICAL  CHIEF COMPLAINT:  Chief Complaint  Patient presents with   Follow-up    Rm 1 alone here for f/u and TOC from Dr. Jannifer Garza. Pt reports sx are the same. Since last visit has been to physical therapy and orthopedist. Since last visit she has also noticed burning/tingling sensations in the bottom of her feet and will radiate to the top of heard.      HISTORY OF PRESENT ILLNESS:   UPDATE (11/02/21, VRP): Since last visit, here for follow-up of ongoing low back pain, muscle spasms, numbness in the legs.  Symptoms aggravated when she is sitting on hard flat surfaces.  Symptoms relieved when she stands up.  Denies any anxiety issues.  PRIOR HPI (03/24/21, Dr. Jannifer Garza): "Sarah Garza is a 51 year old right-handed white female with a history of issues with low back pain and leg pain as well as dizziness and urinary and fecal incontinence in the past.  The patient was seen in May 2017 for these issues.  MRI of the lumbar spine at that time was relatively unremarkable, she was sent for MRI of the cervical and thoracic spine but this was never done.  The patient returns with some similar symptoms.  Within the last 3 weeks, she had a viral illness that she thought was the flu.  Since that time, the patient has been having sudden episodes of low blood sugar on 3 occasions.  The blood sugars may drop rapidly into the 20s and the patient will have syncope or near syncope with this.  She apparently has had documented elevations in insulin levels.  The patient however has a relatively normal hemoglobin A1c of 5.6.  The patient has since begun to have pain from the neck all the way down the back to the low back.  She has hypersensitivity with even light touch on her low back.  She has had episodes of blurred vision and feels that her throat  is closing up at times.  She has had some twitching sensations on the face.  She has noted that when she lies down that she will develop tremor in the legs, if she remains lying down, the tremors will spread to the arms, and she will eventually will develop jerks in the legs that may come on one side first and then spread to the other.  The episodes tend not to occur when she is up on her feet.  The patient once again has had some incontinence of the bowels and occasionally with the bladder.  The patient may have shortness of breath when she wakes up.  She has some difficulty with concentration focusing at times.  She has had a recent MRI of the brain and lumbar spine that were unremarkable.  She is sent to this office for further evaluation."    REVIEW OF SYSTEMS: Full 14 system review of systems performed and negative with exception of: as per HPI.  ALLERGIES: Allergies  Allergen Reactions   Cefdinir Shortness Of Breath and Diarrhea   Doxycycline Hyclate     Other reaction(s): severe thrush when on doxycycline for a long time.   Gluten Meal    Levaquin [Levofloxacin] Other (See Comments)    Joint pain   Mucinex [Guaifenesin Er] Other (See Comments)    "Felt like I was going to pass out"   Penicillins Hives  Robaxin [Methocarbamol]     Caused hotness, and nausea     HOME MEDICATIONS: Outpatient Medications Prior to Visit  Medication Sig Dispense Refill   acetaminophen (TYLENOL) 500 MG tablet Take 500 mg by mouth every 6 (six) hours as needed for moderate pain or headache.     Biotin 5000 MCG TABS Take 5,000 mcg by mouth daily.     clonazePAM (KLONOPIN) 0.5 MG tablet TAKE 1 TABLET(0.5 MG) BY MOUTH AT BEDTIME 30 tablet 0   hydroxychloroquine (PLAQUENIL) 200 MG tablet Take 200 mg by mouth daily.     naproxen sodium (ALEVE) 220 MG tablet Take 220-440 mg by mouth daily as needed (cramps).     Propylene Glycol (SYSTANE BALANCE OP) Apply to eye.     Vitamin D, Ergocalciferol, (DRISDOL) 1.25  MG (50000 UNIT) CAPS capsule Take 50,000 Units by mouth every Friday.     No facility-administered medications prior to visit.      PHYSICAL EXAM  GENERAL EXAM/CONSTITUTIONAL: Vitals:  Vitals:   11/02/21 0853  BP: (!) 97/57  Pulse: 82  Weight: 119 lb (54 kg)  Height: 5\' 4"  (1.626 m)   Body mass index is 20.43 kg/m. Wt Readings from Last 3 Encounters:  11/02/21 119 lb (54 kg)  10/17/21 121 lb (54.9 kg)  10/02/21 120 lb (54.4 kg)   Patient is in no distress; well developed, nourished and groomed; neck is supple  CARDIOVASCULAR: Examination of carotid arteries is normal; no carotid bruits Regular rate and rhythm, no murmurs Examination of peripheral vascular system by observation and palpation is normal  EYES: Ophthalmoscopic exam of optic discs and posterior segments is normal; no papilledema or hemorrhages No results found.  MUSCULOSKELETAL: Gait, strength, tone, movements noted in Neurologic exam below  NEUROLOGIC: MENTAL STATUS:  No flowsheet data found. awake, alert, oriented to person, place and time recent and remote memory intact normal attention and concentration language fluent, comprehension intact, naming intact fund of knowledge appropriate  CRANIAL NERVE:  2nd, 3rd, 4th, 6th - pupils equal and reactive to light, visual fields full to confrontation, extraocular muscles intact, no nystagmus 5th - facial sensation symmetric 7th - facial strength symmetric 8th - hearing intact 9th - palate elevates symmetrically, uvula midline 11th - shoulder shrug symmetric 12th - tongue protrusion midline  MOTOR:  normal bulk and tone, full strength in the BUE, BLE  SENSORY:  normal and symmetric to light touch  COORDINATION:  finger-nose-finger, fine finger movements normal  REFLEXES:  deep tendon reflexes present and symmetric  GAIT/STATION:  narrow based gait     DIAGNOSTIC DATA (LABS, IMAGING, TESTING) - I reviewed patient records, labs, notes,  testing and imaging myself where available.  Lab Results  Component Value Date   WBC 6.8 10/02/2021   HGB 11.0 (L) 10/02/2021   HCT 34.7 (L) 10/02/2021   MCV 91.1 10/02/2021   PLT 102 (L) 10/02/2021      Component Value Date/Time   NA 140 10/02/2021 2058   NA 140 02/15/2016 1009   K 3.9 10/02/2021 2058   CL 108 10/02/2021 2058   CO2 25 10/02/2021 2058   GLUCOSE 105 (H) 10/02/2021 2058   BUN 20 10/02/2021 2058   BUN 16 02/15/2016 1009   CREATININE 0.79 10/02/2021 2058   CALCIUM 8.9 10/02/2021 2058   PROT 7.4 03/21/2021 1022   PROT 7.1 02/15/2016 1009   ALBUMIN 4.2 03/21/2021 1022   ALBUMIN 4.2 02/15/2016 1009   AST 23 03/21/2021 1022   ALT 21 03/21/2021 1022  ALKPHOS 45 03/21/2021 1022   BILITOT 0.8 03/21/2021 1022   BILITOT 0.6 02/15/2016 1009   GFRNONAA >60 10/02/2021 2058   GFRAA 91 02/15/2016 1009   No results found for: CHOL, HDL, LDLCALC, LDLDIRECT, TRIG, CHOLHDL No results found for: HGBA1C Lab Results  Component Value Date   [id number] 02/15/2016   Lab Results  Component Value Date   TSH 2.343 03/09/2021    03/15/21 MRI brain 1. No acute intracranial abnormality. 2. 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  03/21/21 MRI lumbar spine - No substantial change since 2017 examination. Minor degenerative changes are present. There is no high-grade stenosis.  7/13/2 Mildly abnormal MRI scan cervical spine without contrast showing mild spondylitic changes at C5-6 and C6-7 but no significant compression.  03/30/21 Unremarkable MRI scan of the thoracic spine without contrast.   ASSESSMENT AND PLAN  51 y.o. year old female here with:  Meds tried: clonazepam, flexeril, tylenol, dilaudid, morphine  Dx:  1. Chronic bilateral low back pain with left-sided sciatica   2. Sjogren's syndrome, with unspecified organ involvement ([place])     PLAN:  BACK PAIN / MUSCULOSKELETAL - refer to PT evaluation - refer  to pain mgmt - wean off clonazepam (0.5mg  every other day x 2 weeks, then stop; previously started by Dr. Jannifer Garza for anxiety issues, but patient denies these symptoms currently)  Sjogen's disease - can be associated with numbness / neuropathy - continue plaquenil  Orders Placed This Encounter  Procedures   Ambulatory referral to Physical Therapy   Ambulatory referral to Pain Clinic   Return for return to PCP.    Penni Bombard, MD [id number], 0000000 AM Certified in Neurology, Neurophysiology and Neuroimaging  [facility] [address] [place], [place] [address] [phone]

## 2021-11-02 NOTE — Patient Instructions (Signed)
°  BACK PAIN / MUSCULOSKELETAL - refer to PT evaluation - refer to pain mgmt - wean off clonazepam (0.5mg  every other day x 2 weeks, then stop)

## 2021-11-02 NOTE — Telephone Encounter (Signed)
Referral sent to [facility] [phone] 

## 2021-11-12 DIAGNOSIS — J209 Acute bronchitis, unspecified: Secondary | ICD-10-CM | POA: Diagnosis not present

## 2021-11-16 ENCOUNTER — Ambulatory Visit: Payer: BC Managed Care – PPO | Admitting: Diagnostic Neuroimaging

## 2021-11-18 NOTE — Therapy (Signed)
OUTPATIENT PHYSICAL THERAPY EVALUATION   Patient Name: Sarah DowdyMeredith L Garza MRN: [id number] DOB:May 12, 1971, 51 y.o., female Today's Date: 11/21/2021   PT End of Session - 11/21/21 0825     Visit Number 1    Number of Visits 8    Date for PT Re-Evaluation 01/16/22    Authorization Type [employer]    PT Start Time 0830    PT Stop Time 0915    PT Time Calculation (min) 45 min    Activity Tolerance Patient tolerated treatment well    Behavior [facility] for tasks assessed/performed             Past Medical History:  Diagnosis Date   Arterial hypotension 10/17/2021   Diabetes mellitus without complication (HCC)    ITP (idiopathic thrombocytopenic purpura)    Low back pain 02/15/2016   Rheumatoid aortitis    Sjoegren syndrome    Syncope    Likely related to hypotension   Past Surgical History:  Procedure Laterality Date   FOOT SURGERY     HERNIA REPAIR     Patient Active Problem List   Diagnosis Date Noted   DOE (dyspnea on exertion) 10/17/2021   Chest pain of uncertain etiology 10/17/2021   Bilateral lower extremity edema 10/17/2021   Arterial hypotension 10/17/2021   Palpitations 04/16/2020   Cardiac murmur 04/16/2020   Low back pain 02/15/2016    PCP: Sigmund HazelMiller, Lisa, MD  REFERRING PROVIDER: Suanne MarkerPenumalli, Vikram R, MD  REFERRING DIAG: Chronic bilateral low back pain with left-sided sciatica  THERAPY DIAG:  Chronic bilateral low back pain, unspecified whether sciatica present  Muscle weakness (generalized)  ONSET DATE: June 2022  SUBJECTIVE:          SUBJECTIVE STATEMENT: Patient states that this past June she got really sick and was shaking violently from low sugar levels, and she feels that made her tight. She now has episodes of tightness from the front of the hip to the back, feels like it seizes up. Overall these instances have occurred four separate times. Patient states that also when these episodes happen her bowel and bladder empty, she has had multiple  MRIs that ruled out any spinal compression. She is unable to walk fast, and she gets numbness in her legs when she sits extended periods, and when she lays down her back does not relax. Patient reports that she was using a cane for a little while but feels more stable now. She has to go/up and down stairs very slowly.   PERTINENT HISTORY:  Sjgren's syndrome  PAIN:  Are you having pain? Yes NPRS scale: 2-3/10 (7/10 at worst) Pain location: Back Pain orientation: Lower PAIN TYPE: Chronic Pain description: Constant, tightness, achy, tender Aggravating factors: Sitting, walking, stand to sit Relieving factors: Stretching, change of positions, medication   PRECAUTIONS: None  WEIGHT BEARING RESTRICTIONS No  FALLS:  Has patient fallen in last 6 months? 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 No acute intracranial abnormality. 2. 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   03/21/21 MRI lumbar spine 1. No substantial change since 2017 examination. Minor degenerative changes are present. There  is no high-grade stenosis.   7/13/2 Mildly abnormal MRI scan cervical spine without contrast showing mild spondylitic changes at C5-6 and C6-7 but no significant compression.   03/30/21 Unremarkable MRI scan of the thoracic spine without contrast.  PATIENT SURVEYS:  FOTO 50% functional status  SCREENING FOR RED FLAGS: Negative  COGNITION: Overall cognitive status: Within functional limits for tasks assessed     SENSATION: Light touch: Patient reports sensation changes of  bilateral feel, generalized; no specific dermatomal distribution  MUSCLE LENGTH: Hamstring flexibility limited bilaterally Quad and hip flexor flexibility limited bilaterally Piriformis flexibility limited bilaterally  POSTURE:  Rounded shoulders, forward head, slender frame  PALPATION: Tenderness to bilateral lumbar paraspinals, bilateral greater trochanter and gluteal regions  LUMBAR AROM  AROM A/PROM  11/21/2021  Flexion 50% - knee bend indicating hamstring tightness  Extension 50% - spinal pain reported  Right lateral flexion 75% - contralateral hip pain  Left lateral flexion 75% - contralateral hip pain  Right rotation 50% - bilateral hip pain  Left rotation 50%- bilateral hip pain   LE AROM/PROM:   Grossly WFL and non-painful  LE MMT:  MMT Right 11/21/2021 Left 11/21/2021  Core 4-  Hip flexion 4 4  Hip extension 4- 4-  Hip abduction 4- 4-  Knee flexion 5 5  Knee extension 5 5   LUMBAR SPECIAL TESTS:  Lumbar radicular testing grossly negative  FUNCTIONAL TESTS:  Sit to stand: patient requires UE assist with slow/hesitant movement due to pain  GAIT: Assistive device utilized: None Level of assistance: Complete Independence Comments: slow/hesitant gait   TODAY'S TREATMENT  LTR 3 x 5 sec each Piriformis stretch x 20 sec each Hookling stretch with strap x 20 sec each Supine active hamstring stretch 5 x 3 sec each Modified thomas stretch with active knee flexion 5 x 3 sec each Bridge x 10 Clamshell with yellow x 10 each Child's pose stretch x 20 sec  PATIENT EDUCATION:  Education details: Exam findings, POC, HEP Person educated: Patient Education method: Explanation, Demonstration, Tactile cues, Verbal cues, and Handouts Education comprehension: verbalized understanding, returned demonstration, verbal cues required, tactile cues required, and needs further education  HOME EXERCISE PROGRAM: Access Code: [email or web address]   ASSESSMENT: CLINICAL  IMPRESSION: Patient is a 51 y.o. female who was seen today for physical therapy evaluation and treatment for chronic low back pain, and generalized tightness. Her symptoms seem musculoskeletal in nature, however, does report numbness with extended periods of sitting. She demonstrates gross limitations in lumbar motion and core/hip strength. Majority of exercises provided address mobility/flexibility deficits. She would benefit from continued skilled PT to progress mobility and strength in order to reduce pain, improve walking, and maximize functional ability.   OBJECTIVE IMPAIRMENTS Abnormal gait, decreased activity tolerance, decreased ROM, decreased strength, impaired flexibility, impaired sensation, improper body mechanics, postural dysfunction, and pain.   ACTIVITY LIMITATIONS cleaning, community activity, driving, meal prep, occupation, laundry, yard work, and shopping.   PERSONAL FACTORS Fitness, Past/current experiences, and Time since onset of injury/illness/exacerbation are also affecting patient's functional outcome.    REHAB POTENTIAL: Good  CLINICAL DECISION MAKING: Stable/uncomplicated  EVALUATION COMPLEXITY: Low   GOALS: Goals reviewed with patient? Yes  SHORT TERM GOALS:  Patient will be I with initial HEP in order to progress with therapy. Baseline: provided at evaluation Target date: 12/19/2021 Goal status: INITIAL  2.  PT will review FOTO with patient by 3rd visit in order to understand expected progress and outcome with therapy. Baseline: assessed at evaluation Target date:  12/19/2021 Goal status: INITIAL  3.  Patient will report pain level no more than </= 5/10 with activity in order to reduce functional limitations. Baseline: 7/10 pain level Target date: 12/19/2021 Goal status: INITIAL   LONG TERM GOALS:  Patient will be I with final HEP to maintain progress from PT. Baseline: provided at eval Target date: 01/16/2022 Goal status: INITIAL  2.  Patient will  report >/= 62% status on FOTO to indicate improved functional ability. Baseline: 50% functional status Target date: 01/16/2022 Goal status: INITIAL  3.  Patient will demonstrate 25% improvement in lumbar motion in all directions without increase in pain level in order to improve ability to perform household tasks Baseline: patient demonstrates gross lumbar AROM limitations (see above) Target date: 01/16/2022 Goal status: INITIAL  4.  Patient will exhibit core and hip strength grossly >/= 4/5 MMT in order to improve walking ability and return to prior activity level Baseline: core and hip strength grossly 4-/5 MMT Target date: 01/16/2022 Goal status: INITIAL   PLAN: PT FREQUENCY: 1x/week  PT DURATION: 8 weeks  PLANNED INTERVENTIONS: Therapeutic exercises, Therapeutic activity, Neuromuscular re-education, Balance training, Gait training, Patient/Family education, Joint manipulation, Joint mobilization, Aquatic Therapy, Dry Needling, Electrical stimulation, Spinal manipulation, Spinal mobilization, Cryotherapy, Moist heat, Taping, and Manual therapy  PLAN FOR NEXT SESSION: Review HEP and progress PRN, continue with lumbar/LE stretching, manual/dry needling as needed for lumbar/hip mobility, progress core/hip strengthening as tolerated   Rosana Hoes, PT, DPT, LAT, ATC 11/21/21  10:20 AM Phone: [phone] Fax: [phone]

## 2021-11-19 DIAGNOSIS — J208 Acute bronchitis due to other specified organisms: Secondary | ICD-10-CM | POA: Diagnosis not present

## 2021-11-21 ENCOUNTER — Encounter: Payer: Self-pay | Admitting: Physical Therapy

## 2021-11-21 ENCOUNTER — Other Ambulatory Visit: Payer: Self-pay

## 2021-11-21 ENCOUNTER — Ambulatory Visit: Payer: BC Managed Care – PPO | Attending: Diagnostic Neuroimaging | Admitting: Physical Therapy

## 2021-11-21 DIAGNOSIS — M545 Low back pain, unspecified: Secondary | ICD-10-CM | POA: Diagnosis not present

## 2021-11-21 DIAGNOSIS — G8929 Other chronic pain: Secondary | ICD-10-CM | POA: Insufficient documentation

## 2021-11-21 DIAGNOSIS — M6281 Muscle weakness (generalized): Secondary | ICD-10-CM | POA: Diagnosis not present

## 2021-11-21 DIAGNOSIS — M5442 Lumbago with sciatica, left side: Secondary | ICD-10-CM | POA: Diagnosis not present

## 2021-11-21 NOTE — Patient Instructions (Signed)
Access Code: [email or web address] ?URL: [email or web address] ?Date: 11/21/2021 ?Prepared by: Rosana Hoes ? ?Exercises ?Supine Lower Trunk Rotation - 2 x daily - 7 x weekly - 10 reps - 5 seconds hold ?Supine Piriformis Stretch with Foot on Ground - 2 x daily - 7 x weekly - 3 reps - 20-30 seconds hold ?Hooklying Hamstring Stretch with Strap - 2 x daily - 7 x weekly - 3 reps - 20-30 seconds hold ?Supine Hamstring Stretch - 2 x daily - 7 x weekly - 10 reps - 3 seconds hold ?Supine Dynamic Modified Lenis Noon and Hip Flexor Dynamic Stretch - 2 x daily - 7 x weekly - 10 reps - 3 seconds hold ?Bridge - 1 x daily - 7 x weekly - 2 sets - 10 reps ?Clam with Resistance - 1 x daily - 7 x weekly - 2 sets - 10 reps ?Child's Pose Stretch - 2 x daily - 7 x weekly - 3 reps - 20-30 seconds hold ? ?

## 2021-11-23 DIAGNOSIS — M545 Low back pain, unspecified: Secondary | ICD-10-CM | POA: Diagnosis not present

## 2021-11-28 NOTE — Therapy (Incomplete)
?OUTPATIENT PHYSICAL THERAPY TREATMENT NOTE ? ? ?Patient Name: Sarah Garza ?MRN: [id number] ?DOB:1970/10/02, 51 y.o., female ?Today's Date: 11/28/2021 ? ?PCP: Sigmund Hazel, MD ?REFERRING PROVIDER: Sigmund Hazel, MD ? ? ? ?Past Medical History:  ?Diagnosis Date  ? Arterial hypotension 10/17/2021  ? Diabetes mellitus without complication (HCC)   ? ITP (idiopathic thrombocytopenic purpura)   ? Low back pain 02/15/2016  ? Rheumatoid aortitis   ? Sjoegren syndrome   ? Syncope   ? Likely related to hypotension  ? ?Past Surgical History:  ?Procedure Laterality Date  ? FOOT SURGERY    ? HERNIA REPAIR    ? ?Patient Active Problem List  ? Diagnosis Date Noted  ? DOE (dyspnea on exertion) 10/17/2021  ? Chest pain of uncertain etiology 10/17/2021  ? Bilateral lower extremity edema 10/17/2021  ? Arterial hypotension 10/17/2021  ? Palpitations 04/16/2020  ? Cardiac murmur 04/16/2020  ? Low back pain 02/15/2016  ? ? ?REFERRING PROVIDER: Suanne Marker, MD ?  ?REFERRING DIAG: Chronic bilateral low back pain with left-sided sciatica ? ?THERAPY DIAG:  ?No diagnosis found. ? ?PERTINENT HISTORY: Sj?gren's syndrome ? ?PRECAUTIONS: None ? ?SUBJECTIVE: *** ? ?PAIN:  ?Are you having pain? Yes ?NPRS scale: 2-3/10 (7/10 at worst) ?Pain location: Back ?Pain orientation: Lower ?PAIN TYPE: Chronic ?Pain description: Constant, tightness, achy, tender ?Aggravating factors: Sitting, walking, stand to sit ?Relieving factors: Stretching, change of positions, medication ? ?PATIENT GOALS: Pain relief and improve walking and return to prior activity level ? ? ?OBJECTIVE:  ?PATIENT SURVEYS:  ?FOTO 50% functional status ?  ?SENSATION: ?Light touch: Patient reports sensation changes of bilateral feel, generalized; no specific dermatomal distribution ?  ?MUSCLE LENGTH: ?Hamstring flexibility limited bilaterally ?Quad and hip flexor flexibility limited bilaterally ?Piriformis flexibility limited bilaterally ?  ?POSTURE:  ?Rounded shoulders, forward  head, slender frame ?  ?PALPATION: ?Tenderness to bilateral lumbar paraspinals, bilateral greater trochanter and gluteal regions ?  ?LUMBAR AROM ?  ?AROM A/PROM  ?11/21/2021  ?Flexion 50% - knee bend indicating hamstring tightness  ?Extension 50% - spinal pain reported  ?Right lateral flexion 75% - contralateral hip pain  ?Left lateral flexion 75% - contralateral hip pain  ?Right rotation 50% - bilateral hip pain  ?Left rotation 50%- bilateral hip pain  ? ?LE MMT: ?  ?MMT Right ?11/21/2021 Left ?11/21/2021  ?Core 4-  ?Hip flexion 4 4  ?Hip extension 4- 4-  ?Hip abduction 4- 4-  ?Knee flexion 5 5  ?Knee extension 5 5  ? ?FUNCTIONAL TESTS:  ?Sit to stand: patient requires UE assist with slow/hesitant movement due to pain ?  ?GAIT: ?Assistive device utilized: None ?Level of assistance: Complete Independence ?Comments: slow/hesitant gait ?  ?  ?TODAY'S TREATMENT  ?[facility] Adult PT Treatment:                                                DATE: 12/02/2021 ?Therapeutic Exercise: ?LTR 3 x 5 sec each ?Piriformis stretch x 20 sec each ?Hookling stretch with strap x 20 sec each ?Supine active hamstring stretch 5 x 3 sec each ?Modified thomas stretch with active knee flexion 5 x 3 sec each ?Bridge x 10 ?Clamshell with yellow x 10 each ?Child's pose stretch x 20 sec ? ? ?[facility] Adult PT Treatment:  DATE: 11/21/2021 ?Therapeutic Exercise: ?LTR 3 x 5 sec each ?Piriformis stretch x 20 sec each ?Hookling stretch with strap x 20 sec each ?Supine active hamstring stretch 5 x 3 sec each ?Modified thomas stretch with active knee flexion 5 x 3 sec each ?Bridge x 10 ?Clamshell with yellow x 10 each ?Child's pose stretch x 20 sec ?  ?PATIENT EDUCATION:  ?Education details: HEP ?Person educated: Patient ?Education method: Explanation, Demonstration, Tactile cues, Verbal cues, and Handouts ?Education comprehension: verbalized understanding, returned demonstration, verbal cues required, tactile cues required,  and needs further education ?  ?HOME EXERCISE PROGRAM: ?Access Code: [email or web address] ?  ?  ?ASSESSMENT: ?CLINICAL IMPRESSION: ?Patient tolerated therapy well with no adverse effects. *** She would benefit from continued skilled PT to progress mobility and strength in order to reduce pain, improve walking, and maximize functional ability. ? ?Patient is a 51 y.o. female who was seen today for physical therapy evaluation and treatment for chronic low back pain, and generalized tightness. Her symptoms seem musculoskeletal in nature, however, does report numbness with extended periods of sitting. She demonstrates gross limitations in lumbar motion and core/hip strength. Majority of exercises provided address mobility/flexibility deficits ?  ?  ?OBJECTIVE IMPAIRMENTS Abnormal gait, decreased activity tolerance, decreased ROM, decreased strength, impaired flexibility, impaired sensation, improper body mechanics, postural dysfunction, and pain.  ?  ?ACTIVITY LIMITATIONS cleaning, community activity, driving, meal prep, occupation, laundry, yard work, and shopping.  ?  ?PERSONAL FACTORS Fitness, Past/current experiences, and Time since onset of injury/illness/exacerbation are also affecting patient's functional outcome.  ?  ?  ?GOALS: ?Goals reviewed with patient? Yes ?  ?SHORT TERM GOALS: ?  ?Patient will be I with initial HEP in order to progress with therapy. ?Baseline: provided at evaluation ?Target date: 12/19/2021 ?Goal status: INITIAL ?  ?2.  PT will review FOTO with patient by 3rd visit in order to understand expected progress and outcome with therapy. ?Baseline: assessed at evaluation ?Target date: 12/19/2021 ?Goal status: INITIAL ?  ?3.  Patient will report pain level no more than </= 5/10 with activity in order to reduce functional limitations. ?Baseline: 7/10 pain level ?Target date: 12/19/2021 ?Goal status: INITIAL ?  ?  ?LONG TERM GOALS: ?  ?Patient will be I with final HEP to maintain progress from PT. ?Baseline: provided  at eval ?Target date: 01/16/2022 ?Goal status: INITIAL ?  ?2.  Patient will report >/= 62% status on FOTO to indicate improved functional ability. ?Baseline: 50% functional status ?Target date: 01/16/2022 ?Goal status: INITIAL ?  ?3.  Patient will demonstrate 25% improvement in lumbar motion in all directions without increase in pain level in order to improve ability to perform household tasks ?Baseline: patient demonstrates gross lumbar AROM limitations (see above) ?Target date: 01/16/2022 ?Goal status: INITIAL ?  ?4.  Patient will exhibit core and hip strength grossly >/= 4/5 MMT in order to improve walking ability and return to prior activity level ?Baseline: core and hip strength grossly 4-/5 MMT ?Target date: 01/16/2022 ?Goal status: INITIAL ?  ?  ?PLAN: ?PT FREQUENCY: 1x/week ?  ?PT DURATION: 8 weeks ?  ?PLANNED INTERVENTIONS: Therapeutic exercises, Therapeutic activity, Neuromuscular re-education, Balance training, Gait training, Patient/Family education, Joint manipulation, Joint mobilization, Aquatic Therapy, Dry Needling, Electrical stimulation, Spinal manipulation, Spinal mobilization, Cryotherapy, Moist heat, Taping, and Manual therapy ?  ?PLAN FOR NEXT SESSION: Review HEP and progress PRN, continue with lumbar/LE stretching, manual/dry needling as needed for lumbar/hip mobility, progress core/hip strengthening as tolerated ?  ? ? ? ?Rosana Hoesampbell Romelle Muldoon,  PT, DPT, LAT, ATC ?11/28/21  5:05 PM ?Phone: [phone] ?Fax: [phone] ? ? ?  ? ?

## 2021-12-01 ENCOUNTER — Telehealth: Payer: Self-pay | Admitting: Diagnostic Neuroimaging

## 2021-12-01 DIAGNOSIS — R5383 Other fatigue: Secondary | ICD-10-CM | POA: Diagnosis not present

## 2021-12-01 DIAGNOSIS — R61 Generalized hyperhidrosis: Secondary | ICD-10-CM | POA: Diagnosis not present

## 2021-12-01 DIAGNOSIS — N912 Amenorrhea, unspecified: Secondary | ICD-10-CM | POA: Diagnosis not present

## 2021-12-01 MED ORDER — CLONAZEPAM 0.5 MG PO TABS: 0.2500 mg | ORAL_TABLET | Freq: Every day | ORAL | 0 refills | Status: DC

## 2021-12-01 NOTE — Telephone Encounter (Signed)
Pt called needing to speak to the RN or Provider regarding the side affects of tapering off of the clonazePAM (KLONOPIN) 0.5 MG tablet ? ?

## 2021-12-01 NOTE — Telephone Encounter (Signed)
Meds ordered this encounter  ?Medications  ? clonazePAM (KLONOPIN) 0.5 MG tablet  ?  Sig: Take 0.5 tablets (0.25 mg total) by mouth at bedtime.  ?  Dispense:  7 tablet  ?  Refill:  0  ?  Weaning off  ? ? ?Taper rx sent. No need for follow up. ? ? ?Suanne Marker, MD 12/01/2021, 2:00 PM ?Certified in Neurology, Neurophysiology and Neuroimaging ? ?[facility] ?[address], [address] ?[place], [place] [address] ?[phone] ? ?

## 2021-12-01 NOTE — Telephone Encounter (Signed)
Called patient, Sarah Garza informing her Dr Leta Baptist sent in Rx to wean off clonazepam, 1/2 of 0.25 mg tablet at bedtime x 14 days. Left # for questions. ?

## 2021-12-01 NOTE — Telephone Encounter (Signed)
Patient called to clarify she was to taper off clonazepam for 14 days. I informed her Dr Marjory Lies was aware she had already tapered down x 6 days, so yes, she is to take Rx as he sent today .Patient verbalized understanding, appreciation. ? ?

## 2021-12-01 NOTE — Telephone Encounter (Signed)
Called patient who stated she went to pain dr who wanted to pt her on flexeril. He advised she cut back clonazepam to 1/2 tablet x 14 days. He would not give her Rx to get her through the taper. She only had enough for 6 days. She's shaking, not sleeping as well some nights, very strange feeling, sensitive to noise. She is unsure what to do to decrease her side effects and has to speaking at event on Sat. I advised will send to Dr Marjory Lies and let her know.  ?Patient verbalized understanding, appreciation. ? ? ?

## 2021-12-02 ENCOUNTER — Ambulatory Visit: Payer: BC Managed Care – PPO | Admitting: Physical Therapy

## 2021-12-05 NOTE — Therapy (Signed)
?OUTPATIENT PHYSICAL THERAPY TREATMENT NOTE ? ? ?Patient Name: Sarah Garza ?MRN: [id number] ?DOB:1970/10/25, 51 y.o., female ?Today's Date: 12/09/2021 ? ?PCP: Sigmund Hazel, MD ?REFERRING PROVIDER: Sigmund Hazel, MD ? ? PT End of Session - 12/09/21 0754   ? ? Visit Number 2   ? Number of Visits 8   ? Date for PT Re-Evaluation 01/16/22   ? Authorization Type [employer]   ? PT Start Time [phone]   ? PT Stop Time 0829   ? PT Time Calculation (min) 38 min   ? Activity Tolerance Patient tolerated treatment well   ? Behavior During Therapy [facility] for tasks assessed/performed   ? ?  ?  ? ?  ? ? ?Past Medical History:  ?Diagnosis Date  ? Arterial hypotension 10/17/2021  ? Diabetes mellitus without complication (HCC)   ? ITP (idiopathic thrombocytopenic purpura)   ? Low back pain 02/15/2016  ? Rheumatoid aortitis   ? Sjoegren syndrome   ? Syncope   ? Likely related to hypotension  ? ?Past Surgical History:  ?Procedure Laterality Date  ? FOOT SURGERY    ? HERNIA REPAIR    ? ?Patient Active Problem List  ? Diagnosis Date Noted  ? DOE (dyspnea on exertion) 10/17/2021  ? Chest pain of uncertain etiology 10/17/2021  ? Bilateral lower extremity edema 10/17/2021  ? Arterial hypotension 10/17/2021  ? Palpitations 04/16/2020  ? Cardiac murmur 04/16/2020  ? Low back pain 02/15/2016  ? ? ?REFERRING PROVIDER: Suanne Marker, MD ?  ?REFERRING DIAG: Chronic bilateral low back pain with left-sided sciatica ? ?THERAPY DIAG:  ?Chronic bilateral low back pain, unspecified whether sciatica present ? ?Muscle weakness (generalized) ? ?PERTINENT HISTORY: Sj?gren's syndrome ? ?PRECAUTIONS: None ? ?SUBJECTIVE: Patient reports she is doing ok, she started using compression stockings which has helped the burning in her lower leg. Patient states that when she does the exercises she will pain from the left hip down the back of the left leg, and outside of the right hip. ? ?PAIN:  ?Are you having pain? Yes ?NPRS scale: 2-3/10 (7/10 at worst) ?Pain location:  Back ?Pain orientation: Lower ?PAIN TYPE: Chronic ?Pain description: Constant, tightness, achy, tender ?Aggravating factors: Sitting, walking, stand to sit ?Relieving factors: Stretching, change of positions, medication ? ?PATIENT GOALS: Pain relief and improve walking and return to prior activity level ? ? ?OBJECTIVE:  ?PATIENT SURVEYS:  ?FOTO 50% functional status ?  ?SENSATION: ?Light touch: Patient reports sensation changes of bilateral feel, generalized; no specific dermatomal distribution ?  ?MUSCLE LENGTH: ?Hamstring flexibility limited bilaterally ?Quad and hip flexor flexibility limited bilaterally ?Piriformis flexibility limited bilaterally ?  ?POSTURE:  ?Rounded shoulders, forward head, slender frame ?  ?PALPATION: ?Tenderness to bilateral lumbar paraspinals, bilateral greater trochanter and gluteal regions ?  ?LUMBAR AROM ?  ?AROM A/PROM  ?11/21/2021  ?Flexion 50% - knee bend indicating hamstring tightness  ?Extension 50% - spinal pain reported  ?Right lateral flexion 75% - contralateral hip pain  ?Left lateral flexion 75% - contralateral hip pain  ?Right rotation 50% - bilateral hip pain  ?Left rotation 50%- bilateral hip pain  ? ?LE MMT: ?  ?MMT Right ?11/21/2021 Left ?11/21/2021  ?Core 4-  ?Hip flexion 4 4  ?Hip extension 4- 4-  ?Hip abduction 4- 4-  ?Knee flexion 5 5  ?Knee extension 5 5  ? ?FUNCTIONAL TESTS:  ?Sit to stand: patient requires UE assist with slow/hesitant movement due to pain ?  ?GAIT: ?Assistive device utilized: None ?Level of assistance: Complete  Independence ?Comments: slow/hesitant gait ?  ?  ?TODAY'S TREATMENT  ?[facility] Adult PT Treatment:                                                DATE: 12/09/2021 ?Therapeutic Exercise: ?NuStep L5 x 5 min with UE/LE while taking subjective ?LTR x 10 ?Piriformis stretch 2 x 20 sec each ?Modified thomas stretch with passive knee flexion 2 x 30 sec each ?Bridge 10 x 3 sec  ?Sidelying thoracic rotation x 10 each ?Sidelying hip abduction x 10 each ?Child's  pose stretch fwd x 15 sec, side x 10 sec each ?Cat cow x 5 ? ? ?[facility] Adult PT Treatment:                                                DATE: 11/21/2021 ?Therapeutic Exercise: ?LTR 3 x 5 sec each ?Piriformis stretch x 20 sec each ?Hookling stretch with strap x 20 sec each ?Supine active hamstring stretch 5 x 3 sec each ?Modified thomas stretch with active knee flexion 5 x 3 sec each ?Bridge x 10 ?Clamshell with yellow x 10 each ?Child's pose stretch x 20 sec ?  ?PATIENT EDUCATION:  ?Education details: HEP ?Person educated: Patient ?Education method: Explanation, Demonstration, Tactile cues, Verbal cues ?Education comprehension: verbalized understanding, returned demonstration, verbal cues required, tactile cues required, and needs further education ?  ?HOME EXERCISE PROGRAM: ?Access Code: [email or web address] ?  ?  ?ASSESSMENT: ?CLINICAL IMPRESSION: ?Patient tolerated therapy well with no adverse effects. Therapy continues to focus on stretching/mobility for the hips and spine, and gradual progression of core and hip strengthening. She was able to complete all prescribed exercises this visit but did note feeling of impending cramp with glute strengthening likely due to weakness and fatigue. Patient states she felt looser following therapy, but reports when she sits for a while then she can feels all the muscles of her back and soreness when she goes to get up. No changes made to HEP, will assess patient's response to exercise to determine progression at next visit. She would benefit from continued skilled PT to progress mobility and strength in order to reduce pain, improve walking, and maximize functional ability. ?  ?  ?OBJECTIVE IMPAIRMENTS Abnormal gait, decreased activity tolerance, decreased ROM, decreased strength, impaired flexibility, impaired sensation, improper body mechanics, postural dysfunction, and pain.  ?  ?ACTIVITY LIMITATIONS cleaning, community activity, driving, meal prep, occupation, laundry, yard work, and  shopping.  ?  ?PERSONAL FACTORS Fitness, Past/current experiences, and Time since onset of injury/illness/exacerbation are also affecting patient's functional outcome.  ?  ?  ?GOALS: ?Goals reviewed with patient? Yes ?  ?SHORT TERM GOALS: ?  ?Patient will be I with initial HEP in order to progress with therapy. ?Baseline: provided at evaluation ?Target date: 12/19/2021 ?Goal status: INITIAL ?  ?2.  PT will review FOTO with patient by 3rd visit in order to understand expected progress and outcome with therapy. ?Baseline: assessed at evaluation ?Target date: 12/19/2021 ?Goal status: INITIAL ?  ?3.  Patient will report pain level no more than </= 5/10 with activity in order to reduce functional limitations. ?Baseline: 7/10 pain level ?Target date: 12/19/2021 ?Goal status: INITIAL ?  ?  ?LONG TERM GOALS: ?  ?  Patient will be I with final HEP to maintain progress from PT. ?Baseline: provided at eval ?Target date: 01/16/2022 ?Goal status: INITIAL ?  ?2.  Patient will report >/= 62% status on FOTO to indicate improved functional ability. ?Baseline: 50% functional status ?Target date: 01/16/2022 ?Goal status: INITIAL ?  ?3.  Patient will demonstrate 25% improvement in lumbar motion in all directions without increase in pain level in order to improve ability to perform household tasks ?Baseline: patient demonstrates gross lumbar AROM limitations (see above) ?Target date: 01/16/2022 ?Goal status: INITIAL ?  ?4.  Patient will exhibit core and hip strength grossly >/= 4/5 MMT in order to improve walking ability and return to prior activity level ?Baseline: core and hip strength grossly 4-/5 MMT ?Target date: 01/16/2022 ?Goal status: INITIAL ?  ?  ?PLAN: ?PT FREQUENCY: 1x/week ?  ?PT DURATION: 8 weeks ?  ?PLANNED INTERVENTIONS: Therapeutic exercises, Therapeutic activity, Neuromuscular re-education, Balance training, Gait training, Patient/Family education, Joint manipulation, Joint mobilization, Aquatic Therapy, Dry Needling, Electrical  stimulation, Spinal manipulation, Spinal mobilization, Cryotherapy, Moist heat, Taping, and Manual therapy ?  ?PLAN FOR NEXT SESSION: Review HEP and progress PRN, continue with lumbar/LE stretching, manual/

## 2021-12-06 ENCOUNTER — Encounter: Payer: Self-pay | Admitting: Cardiology

## 2021-12-06 ENCOUNTER — Telehealth: Payer: Self-pay | Admitting: Cardiology

## 2021-12-06 NOTE — Telephone Encounter (Signed)
Spoke with patient who asked about the size of compression stockings she needs to purchase. Informed patient to get the 15 - 20 mmHg strength, which was documented in her medical record. Also, she stated she has burning sensation in both legs and arms. She also stated her neurologist is tapering her off the clonazepam. ?

## 2021-12-06 NOTE — Telephone Encounter (Signed)
Burning sensation sounds like neuropathy -- would discuss with Neurologist. ? ?Plan was to start with 15-20 mmHg compression stockings - will probably increase strength once we see that she can tolerate lower strength. ? ?Bryan Lemma, MD ? ?

## 2021-12-06 NOTE — Telephone Encounter (Signed)
New Message: ? ? ? ? ?Patient said Dr Herbie Baltimore recommended that she wear support hose, but she does not remember the size he told her to get. ?

## 2021-12-09 ENCOUNTER — Other Ambulatory Visit: Payer: Self-pay

## 2021-12-09 ENCOUNTER — Ambulatory Visit: Payer: BC Managed Care – PPO | Admitting: Physical Therapy

## 2021-12-09 ENCOUNTER — Encounter: Payer: Self-pay | Admitting: Physical Therapy

## 2021-12-09 DIAGNOSIS — G8929 Other chronic pain: Secondary | ICD-10-CM | POA: Diagnosis not present

## 2021-12-09 DIAGNOSIS — M6281 Muscle weakness (generalized): Secondary | ICD-10-CM

## 2021-12-09 DIAGNOSIS — M5442 Lumbago with sciatica, left side: Secondary | ICD-10-CM | POA: Diagnosis not present

## 2021-12-09 DIAGNOSIS — M545 Low back pain, unspecified: Secondary | ICD-10-CM | POA: Diagnosis not present

## 2021-12-16 ENCOUNTER — Ambulatory Visit: Payer: BC Managed Care – PPO | Admitting: Physical Therapy

## 2021-12-16 ENCOUNTER — Encounter: Payer: Self-pay | Admitting: Physical Therapy

## 2021-12-16 DIAGNOSIS — M6281 Muscle weakness (generalized): Secondary | ICD-10-CM | POA: Diagnosis not present

## 2021-12-16 DIAGNOSIS — M545 Low back pain, unspecified: Secondary | ICD-10-CM | POA: Diagnosis not present

## 2021-12-16 DIAGNOSIS — G8929 Other chronic pain: Secondary | ICD-10-CM

## 2021-12-16 DIAGNOSIS — Z1231 Encounter for screening mammogram for malignant neoplasm of breast: Secondary | ICD-10-CM | POA: Diagnosis not present

## 2021-12-16 DIAGNOSIS — M5442 Lumbago with sciatica, left side: Secondary | ICD-10-CM | POA: Diagnosis not present

## 2021-12-16 NOTE — Therapy (Signed)
?OUTPATIENT PHYSICAL THERAPY TREATMENT NOTE ? ? ?Patient Name: Sarah Garza ?MRN: [id number] ?DOB:09/01/1971, 51 y.o., female ?Today's Date: 12/16/2021 ? ?PCP: Sigmund Hazel, MD ?REFERRING PROVIDER: Sigmund Hazel, MD ? ? PT End of Session - 12/16/21 0722   ? ? Visit Number 3   ? Number of Visits 8   ? Date for PT Re-Evaluation 01/16/22   ? Authorization Type BCBS   ? PT Start Time 0720   ? PT Stop Time 0800   ? PT Time Calculation (min) 40 min   ? ?  ?  ? ?  ? ? ?Past Medical History:  ?Diagnosis Date  ? Arterial hypotension 10/17/2021  ? Diabetes mellitus without complication (HCC)   ? ITP (idiopathic thrombocytopenic purpura)   ? Low back pain 02/15/2016  ? Rheumatoid aortitis   ? Sjoegren syndrome   ? Syncope   ? Likely related to hypotension  ? ?Past Surgical History:  ?Procedure Laterality Date  ? FOOT SURGERY    ? HERNIA REPAIR    ? ?Patient Active Problem List  ? Diagnosis Date Noted  ? DOE (dyspnea on exertion) 10/17/2021  ? Chest pain of uncertain etiology 10/17/2021  ? Bilateral lower extremity edema 10/17/2021  ? Arterial hypotension 10/17/2021  ? Palpitations 04/16/2020  ? Cardiac murmur 04/16/2020  ? Low back pain 02/15/2016  ? ? ?REFERRING PROVIDER: Suanne Marker, MD ?  ?REFERRING DIAG: Chronic bilateral low back pain with left-sided sciatica ? ?THERAPY DIAG:  ?Chronic bilateral low back pain, unspecified whether sciatica present ? ?Muscle weakness (generalized) ? ?PERTINENT HISTORY: Sj?gren's syndrome ? ?PRECAUTIONS: None ? ?SUBJECTIVE: Pt reports she felt good after last session and did better for 4 days. However, MD is concerned about her LE edema and asked her to sit more at work which causes increased back pain.  ? ?PAIN:  ?Are you having pain? Yes ?NPRS scale: 2/10 (7/10 at worst) ?Pain location: Back ?Pain orientation: Lower ?PAIN TYPE: Chronic ?Pain description: Constant, tightness, achy, tender ?Aggravating factors: Sitting, walking, stand to sit ?Relieving factors: Stretching, change of  positions, medication ? ?PATIENT GOALS: Pain relief and improve walking and return to prior activity level ? ? ?OBJECTIVE:  ?PATIENT SURVEYS:  ?FOTO 50% functional status ?  ?SENSATION: ?Light touch: Patient reports sensation changes of bilateral feel, generalized; no specific dermatomal distribution ?  ?MUSCLE LENGTH: ?Hamstring flexibility limited bilaterally ?Quad and hip flexor flexibility limited bilaterally ?Piriformis flexibility limited bilaterally ?  ?POSTURE:  ?Rounded shoulders, forward head, slender frame ?  ?PALPATION: ?Tenderness to bilateral lumbar paraspinals, bilateral greater trochanter and gluteal regions ?  ?LUMBAR AROM ?  ?AROM A/PROM  ?11/21/2021  ?Flexion 50% - knee bend indicating hamstring tightness  ?Extension 50% - spinal pain reported  ?Right lateral flexion 75% - contralateral hip pain  ?Left lateral flexion 75% - contralateral hip pain  ?Right rotation 50% - bilateral hip pain  ?Left rotation 50%- bilateral hip pain  ? ?LE MMT: ?  ?MMT Right ?11/21/2021 Left ?11/21/2021  ?Core 4-  ?Hip flexion 4 4  ?Hip extension 4- 4-  ?Hip abduction 4- 4-  ?Knee flexion 5 5  ?Knee extension 5 5  ? ?FUNCTIONAL TESTS:  ?Sit to stand: patient requires UE assist with slow/hesitant movement due to pain ?  ?GAIT: ?Assistive device utilized: None ?Level of assistance: Complete Independence ?Comments: slow/hesitant gait ?  ?  ?TODAY'S TREATMENT  ?[facility] Adult PT Treatment:  DATE: 12/16/2021 ?Therapeutic Exercise: ?Seated - modified childs pose for work option- laterals x 1 each- pt likes this stretch ?Seated pelvic tilts ?Seated figure 4 push and pull  ?NuStep L5 x 5 min with UE/LE while taking subjective ?Bridge 10 x 3 sec articulating  ?Ppt x 10 ?SKTC ?Figure 4 push and pull ?Sidelying thoracic rotation x 10 each ? ? ?[facility] Adult PT Treatment:                                                DATE: 12/09/2021 ?Therapeutic Exercise: ?NuStep L5 x 5 min with UE/LE while taking  subjective ?LTR x 10 ?Piriformis stretch 2 x 20 sec each ?Modified thomas stretch with passive knee flexion 2 x 30 sec each ?Bridge 10 x 3 sec  ?Sidelying thoracic rotation x 10 each ?Sidelying hip abduction x 10 each ?Child's pose stretch fwd x 15 sec, side x 10 sec each ?Cat cow x 5 ? ? ?[facility] Adult PT Treatment:                                                DATE: 11/21/2021 ?Therapeutic Exercise: ?LTR 3 x 5 sec each ?Piriformis stretch x 20 sec each ?Hookling stretch with strap x 20 sec each ?Supine active hamstring stretch 5 x 3 sec each ?Modified thomas stretch with active knee flexion 5 x 3 sec each ?Bridge x 10 ?Clamshell with yellow x 10 each ?Child's pose stretch x 20 sec ?  ?PATIENT EDUCATION:  ?Education details: HEP ?Person educated: Patient ?Education method: Explanation, Demonstration, Tactile cues, Verbal cues ?Education comprehension: verbalized understanding, returned demonstration, verbal cues required, tactile cues required, and needs further education ?  ?HOME EXERCISE PROGRAM: ?Access Code: [email or web address] ?URL: [email or web address] ?Date: 12/16/2021 ?Prepared by: Jannette Spanner ? ?Exercises ?- Supine Lower Trunk Rotation  - 2 x daily - 7 x weekly - 10 reps - 5 seconds hold ?- Supine Piriformis Stretch with Foot on Ground  - 2 x daily - 7 x weekly - 3 reps - 20-30 seconds hold ?- Hooklying Hamstring Stretch with Strap  - 2 x daily - 7 x weekly - 3 reps - 20-30 seconds hold ?- Supine Hamstring Stretch  - 2 x daily - 7 x weekly - 10 reps - 3 seconds hold ?- Supine Dynamic Modified Thomas Quad and Hip Flexor Dynamic Stretch  - 2 x daily - 7 x weekly - 10 reps - 3 seconds hold ?- Bridge  - 1 x daily - 7 x weekly - 2 sets - 10 reps ?- Clam with Resistance  - 1 x daily - 7 x weekly - 2 sets - 10 reps ?- Child's Pose Stretch  - 2 x daily - 7 x weekly - 3 reps - 20-30 seconds hold ?- Seated Childs pose with exercise ball, chair or table  - 1 x daily - 7 x weekly - 1 sets - 5 reps - 10 hold ?-  Seated Piriformis Stretch  - 1 x daily - 7 x weekly - 1 sets - 3 reps - 10-20 hold ?- Seated, supine or standing Pelvic Tilit  - 1 x daily - 7 x weekly - 1 sets - 10 reps - 5 hold ?  ?  ?  ASSESSMENT: ?CLINICAL IMPRESSION: ?Patient tolerated therapy well with no adverse effects. Therapy continues to focus on stretching/mobility for the hips and spine, and gradual progression of core and hip strengthening. She reports recent donning of compression socks for edema has helped her edema however the increase in sitting has aggravated her back pain. She was instructed in seated lumbo pelvic mobility and hip stretches. Increased tightness noted with left internal rotation.  ?She was able to complete all prescribed exercises this visit  with no complaints of cramp. She reported feeling loose at end of session. Her HEP was updated to include options for work.  She would benefit from continued skilled PT to progress mobility and strength in order to reduce pain, improve walking, and maximize functional ability. ?  ?  ?OBJECTIVE IMPAIRMENTS Abnormal gait, decreased activity tolerance, decreased ROM, decreased strength, impaired flexibility, impaired sensation, improper body mechanics, postural dysfunction, and pain.  ?  ?ACTIVITY LIMITATIONS cleaning, community activity, driving, meal prep, occupation, laundry, yard work, and shopping.  ?  ?PERSONAL FACTORS Fitness, Past/current experiences, and Time since onset of injury/illness/exacerbation are also affecting patient's functional outcome.  ?  ?  ?GOALS: ?Goals reviewed with patient? Yes ?  ?SHORT TERM GOALS: ?  ?Patient will be I with initial HEP in order to progress with therapy. ?Baseline: provided at evaluation ?Target date: 12/19/2021 ?Goal status: INITIAL ?  ?2.  PT will review FOTO with patient by 3rd visit in order to understand expected progress and outcome with therapy. ?Baseline: assessed at evaluation ?Target date: 12/19/2021 ?Goal status: INITIAL ?  ?3.  Patient will  report pain level no more than </= 5/10 with activity in order to reduce functional limitations. ?Baseline: 7/10 pain level ?Target date: 12/19/2021 ?Goal status: INITIAL ?  ?  ?LONG TERM GOALS: ?  ?Patient

## 2021-12-19 NOTE — Therapy (Signed)
?OUTPATIENT PHYSICAL THERAPY TREATMENT NOTE ? ? ?Patient Name: Sarah Garza ?MRN: [id number] ?DOB:04/26/1971, 51 y.o., female ?Today's Date: 12/20/2021 ? ?PCP: Kathyrn Lass, MD ?REFERRING PROVIDER: Kathyrn Lass, MD ? ? PT End of Session - 12/20/21 0831   ? ? Visit Number 4   ? Number of Visits 8   ? Date for PT Re-Evaluation 01/16/22   ? Authorization Type BCBS   ? PT Start Time 0830   ? PT Stop Time 0910   ? PT Time Calculation (min) 40 min   ? Activity Tolerance Patient tolerated treatment well   ? Behavior During Therapy [facility] for tasks assessed/performed   ? ?  ?  ? ?  ? ? ? ?Past Medical History:  ?Diagnosis Date  ? Arterial hypotension 10/17/2021  ? Diabetes mellitus without complication ([place])   ? ITP (idiopathic thrombocytopenic purpura)   ? Low back pain 02/15/2016  ? Rheumatoid aortitis   ? Sjoegren syndrome   ? Syncope   ? Likely related to hypotension  ? ?Past Surgical History:  ?Procedure Laterality Date  ? FOOT SURGERY    ? HERNIA REPAIR    ? ?Patient Active Problem List  ? Diagnosis Date Noted  ? DOE (dyspnea on exertion) 10/17/2021  ? Chest pain of uncertain etiology 41/96/2229  ? Bilateral lower extremity edema 10/17/2021  ? Arterial hypotension 10/17/2021  ? Palpitations 04/16/2020  ? Cardiac murmur 04/16/2020  ? Low back pain 02/15/2016  ? ? ?REFERRING PROVIDER: Penni Bombard, MD ?  ?REFERRING DIAG: Chronic bilateral low back pain with left-sided sciatica ? ?THERAPY DIAG:  ?Chronic bilateral low back pain, unspecified whether sciatica present ? ?Muscle weakness (generalized) ? ?PERTINENT HISTORY: Sj?gren's syndrome ? ?PRECAUTIONS: None ? ?SUBJECTIVE: Patient states she has been alright. She states she can't sit long at work because her back pain, so she does stand. She has noticed improvement in her swelling and leg pain since using the compression stockings. ? ?PAIN:  ?Are you having pain? Yes ?NPRS scale: 1/10 ?Pain location: Back ?Pain orientation: Lower ?PAIN TYPE: Chronic ?Pain  description: Constant, tightness, achy, tender ?Aggravating factors: Sitting, walking, stand to sit ?Relieving factors: Stretching, change of positions, medication ? ?PATIENT GOALS: Pain relief and improve walking and return to prior activity level ? ? ?OBJECTIVE:  ?PATIENT SURVEYS:  ?FOTO 50% functional status ?  ?SENSATION: ?Light touch: Patient reports sensation changes of bilateral feel, generalized; no specific dermatomal distribution ?  ?MUSCLE LENGTH: ?Hamstring flexibility limited bilaterally ?Quad and hip flexor flexibility limited bilaterally ?Piriformis flexibility limited bilaterally ?  ?POSTURE:  ?Rounded shoulders, forward head, slender frame ?  ?PALPATION: ?Tenderness to bilateral lumbar paraspinals, bilateral greater trochanter and gluteal regions ?  ?LUMBAR AROM ?  ?AROM A/PROM  ?11/21/2021  ?Flexion 50% - knee bend indicating hamstring tightness  ?Extension 50% - spinal pain reported  ?Right lateral flexion 75% - contralateral hip pain  ?Left lateral flexion 75% - contralateral hip pain  ?Right rotation 50% - bilateral hip pain  ?Left rotation 50%- bilateral hip pain  ? ?LE MMT: ?  ?MMT Right ?11/21/2021 Left ?11/21/2021  ?12/20/2021  ?Core 4- 4-  ?Hip flexion 4 4   ?Hip extension 4- 4-   ?Hip abduction 4- 4-   ?Knee flexion 5 5   ?Knee extension 5 5   ? ?FUNCTIONAL TESTS:  ?Sit to stand: patient requires UE assist with slow/hesitant movement due to pain ?  ?GAIT: ?Assistive device utilized: None ?Level of assistance: Complete Independence ?Comments: slow/hesitant gait ?  ?  ?  TODAY'S TREATMENT  ?[facility] Adult PT Treatment:                                                DATE: 12/20/2021 ?Therapeutic Exercise: ?NuStep L7 x 5 min with UE/LE while taking subjective ?LTR in figure-4 position 5 x 5 sec each ?Piriformis stretch 2 x 20 sec each ?Sidelying thoracic rotation 5 x 5 sec each ?1/2 kneeling hip flexor/quad stretch 2 x 20 sec each ?Bridge 10 x 5 sec ?Figure-4 bridge 5 x 5 sec each ?Modified side plank with  clamshell 2 x 10 each ?Modified front plank on knees 2 x 20 sec ? ? ?[facility] Adult PT Treatment:                                                DATE: 12/16/2021 ?Therapeutic Exercise: ?Seated - modified childs pose for work option- laterals x 1 each- pt likes this stretch ?Seated pelvic tilts ?Seated figure 4 push and pull  ?NuStep L5 x 5 min with UE/LE while taking subjective ?Bridge 10 x 3 sec articulating  ?Ppt x 10 ?SKTC ?Figure 4 push and pull ?Sidelying thoracic rotation x 10 each ? ?[facility] Adult PT Treatment:                                                DATE: 12/09/2021 ?Therapeutic Exercise: ?NuStep L5 x 5 min with UE/LE while taking subjective ?LTR x 10 ?Piriformis stretch 2 x 20 sec each ?Modified thomas stretch with passive knee flexion 2 x 30 sec each ?Bridge 10 x 3 sec  ?Sidelying thoracic rotation x 10 each ?Sidelying hip abduction x 10 each ?Child's pose stretch fwd x 15 sec, side x 10 sec each ?Cat cow x 5 ?  ?PATIENT EDUCATION:  ?Education details: HEP udpate ?Person educated: Patient ?Education method: Explanation, Demonstration, Tactile cues, Verbal cues, Handout ?Education comprehension: verbalized understanding, returned demonstration, verbal cues required, tactile cues required, and needs further education ?  ?HOME EXERCISE PROGRAM: ?Access Code: [email or web address] ? ?  ?ASSESSMENT: ?CLINICAL IMPRESSION: ?Patient tolerated therapy well with no adverse effects. Therapy focused primarily on progression of mobility and strength for spine and hips. Continued progression of core stability and updated HEP to progress at home. She does continue to report feeling looser post therapy. She would benefit from continued skilled PT to progress mobility and strength in order to reduce pain, improve walking, and maximize functional ability.  ?  ?  ?OBJECTIVE IMPAIRMENTS Abnormal gait, decreased activity tolerance, decreased ROM, decreased strength, impaired flexibility, impaired sensation, improper body mechanics, postural  dysfunction, and pain.  ?  ?ACTIVITY LIMITATIONS cleaning, community activity, driving, meal prep, occupation, laundry, yard work, and shopping.  ?  ?PERSONAL FACTORS Fitness, Past/current experiences, and Time since onset of injury/illness/exacerbation are also affecting patient's functional outcome.  ?  ?  ?GOALS: ?Goals reviewed with patient? Yes ?  ?SHORT TERM GOALS: ?  ?Patient will be I with initial HEP in order to progress with therapy. ?Baseline: provided at evaluation ?12/20/2021: independent ?Target date: 12/19/2021 ?Goal status: MET ?  ?2.  PT will  review FOTO with patient by 3rd visit in order to understand expected progress and outcome with therapy. ?Baseline: assessed at evaluation ?12/20/2021: reviewed ?Target date: 12/19/2021 ?Goal status: MET ?  ?3.  Patient will report pain level no more than </= 5/10 with activity in order to reduce functional limitations. ?Baseline: 7/10 pain level ?12/20/2021: 5/10 ?Target date: 12/19/2021 ?Goal status: MET ?  ?  ?LONG TERM GOALS: ?  ?Patient will be I with final HEP to maintain progress from PT. ?Baseline: provided at eval ?Target date: 01/16/2022 ?Goal status: INITIAL ?  ?2.  Patient will report >/= 62% status on FOTO to indicate improved functional ability. ?Baseline: 50% functional status ?Target date: 01/16/2022 ?Goal status: INITIAL ?  ?3.  Patient will demonstrate 25% improvement in lumbar motion in all directions without increase in pain level in order to improve ability to perform household tasks ?Baseline: patient demonstrates gross lumbar AROM limitations (see above) ?Target date: 01/16/2022 ?Goal status: INITIAL ?  ?4.  Patient will exhibit core and hip strength grossly >/= 4/5 MMT in order to improve walking ability and return to prior activity level ?Baseline: core and hip strength grossly 4-/5 MMT ?Target date: 01/16/2022 ?Goal status: INITIAL ?  ?  ?PLAN: ?PT FREQUENCY: 1x/week ?  ?PT DURATION: 8 weeks ?  ?PLANNED INTERVENTIONS: Therapeutic exercises, Therapeutic  activity, Neuromuscular re-education, Balance training, Gait training, Patient/Family education, Joint manipulation, Joint mobilization, Aquatic Therapy, Dry Needling, Electrical stimulation, Spinal manipulation, S

## 2021-12-20 ENCOUNTER — Ambulatory Visit: Payer: BC Managed Care – PPO | Attending: Diagnostic Neuroimaging | Admitting: Physical Therapy

## 2021-12-20 ENCOUNTER — Encounter: Payer: Self-pay | Admitting: Physical Therapy

## 2021-12-20 ENCOUNTER — Other Ambulatory Visit: Payer: Self-pay

## 2021-12-20 DIAGNOSIS — G8929 Other chronic pain: Secondary | ICD-10-CM | POA: Insufficient documentation

## 2021-12-20 DIAGNOSIS — M6281 Muscle weakness (generalized): Secondary | ICD-10-CM | POA: Insufficient documentation

## 2021-12-20 DIAGNOSIS — M545 Low back pain, unspecified: Secondary | ICD-10-CM | POA: Insufficient documentation

## 2021-12-20 NOTE — Patient Instructions (Signed)
Access Code: [email or web address] ?URL: [email or web address] ?Date: 12/20/2021 ?Prepared by: Rosana Hoes ? ?Exercises ?- Supine Lower Trunk Rotation  - 2 x daily - 7 x weekly - 10 reps - 5 seconds hold ?- Supine Piriformis Stretch with Foot on Ground  - 2 x daily - 7 x weekly - 3 reps - 20-30 seconds hold ?- Hooklying Hamstring Stretch with Strap  - 2 x daily - 7 x weekly - 3 reps - 20-30 seconds hold ?- Supine Hamstring Stretch  - 2 x daily - 7 x weekly - 10 reps - 3 seconds hold ?- Half Kneeling Hip Flexor Stretch  - 1 x daily - 7 x weekly - 3 reps - 20 seconds hold ?- Figure 4 Bridge  - 1 x daily - 2 sets - 5-10 reps - 5 seconds hold ?- Side Plank with Clam  - 1 x daily - 2 sets - 10 reps ?- Plank on Knees  - 1 x daily - 3 reps - 20 secons hold ?- Child's Pose Stretch  - 2 x daily - 7 x weekly - 3 reps - 20-30 seconds hold ?- Seated Childs pose with exercise ball, chair or table  - 1 x daily - 7 x weekly - 1 sets - 5 reps - 10 hold ?- Seated Piriformis Stretch  - 1 x daily - 7 x weekly - 1 sets - 3 reps - 10-20 hold ?- Seated, supine or standing Pelvic Tilit  - 1 x daily - 7 x weekly - 1 sets - 10 reps - 5 hold ?

## 2021-12-21 ENCOUNTER — Encounter: Payer: Self-pay | Admitting: Cardiology

## 2021-12-21 ENCOUNTER — Ambulatory Visit: Payer: BC Managed Care – PPO | Admitting: Cardiology

## 2021-12-21 VITALS — BP 82/64 | HR 69 | Ht 64.0 in | Wt 118.6 lb

## 2021-12-21 DIAGNOSIS — R922 Inconclusive mammogram: Secondary | ICD-10-CM | POA: Diagnosis not present

## 2021-12-21 DIAGNOSIS — R600 Localized edema: Secondary | ICD-10-CM

## 2021-12-21 DIAGNOSIS — I950 Idiopathic hypotension: Secondary | ICD-10-CM

## 2021-12-21 DIAGNOSIS — R928 Other abnormal and inconclusive findings on diagnostic imaging of breast: Secondary | ICD-10-CM | POA: Diagnosis not present

## 2021-12-21 DIAGNOSIS — R002 Palpitations: Secondary | ICD-10-CM | POA: Diagnosis not present

## 2021-12-21 DIAGNOSIS — R921 Mammographic calcification found on diagnostic imaging of breast: Secondary | ICD-10-CM | POA: Diagnosis not present

## 2021-12-21 DIAGNOSIS — R0609 Other forms of dyspnea: Secondary | ICD-10-CM | POA: Diagnosis not present

## 2021-12-21 MED ORDER — MIDODRINE HCL 2.5 MG PO TABS: 2.5000 mg | ORAL_TABLET | Freq: Every day | ORAL | 2 refills | Status: DC

## 2021-12-21 NOTE — Patient Instructions (Signed)
Medication Instructions:  ? ?Midodrine 2.5 mg  take one tablet every morning , may take an evening dose if needed ( up to 2 tablets a day total)  ?                                                                                                                                                                                                                                                                                                                                  ?*If you need a refill on your cardiac medications before your next appointment, please call your pharmacy* ? ? ?Lab Work: ?Not needed ? ? ? ?Testing/Procedures: ? ?Not needed ? ?Follow-Up: ?[facility], you and your health needs are our priority.  As part of our continuing mission to provide you with exceptional heart care, we have created designated Provider Care Teams.  These Care Teams include your primary Cardiologist (physician) and Advanced Practice Providers (APPs -  Physician Assistants and Nurse Practitioners) who all work together to provide you with the care you need, when you need it. ? ?  ? ?Your next appointment:   ?6 month(s) ? ?The format for your next appointment:   ?In Person ? ?Provider:   ?Bryan Lemmaavid Harding, MD  ? ? ?

## 2021-12-21 NOTE — Progress Notes (Signed)
? ? ?Primary Care Provider: Kathyrn Lass, MD ?Cardiologist: Glenetta Hew, MD ?Electrophysiologist: None ? ?Clinic Note: ?Chief Complaint  ?Patient presents with  ? Follow-up  ?  Test results  ? Hypotension  ?  Blood pressure still low.  ?=================================== ? ?ASSESSMENT/PLAN  ? ?Problem List Items Addressed This Visit   ? ?  ? Cardiology Problems  ? Arterial hypotension - Primary (Chronic)  ?  Blood pressures are still very low.  Amazingly she is able to walk in here without difficulty.  This clearly explains her dizziness. ? ?Plan: ?We will start midodrine 2.5 mg in the morning and take if still dizzy. ?Continue to recommend adequate hydration with hydration Promus. ?Liberalize salt intake. ?Continue support stockings and leg elevation especially with edema. ?  ?  ? Relevant Medications  ? midodrine (PROAMATINE) 2.5 MG tablet  ?  ? Other  ? Palpitations  ? DOE (dyspnea on exertion)  ?  She never went for CPX.  Still ordered.  Echo was normal. ? ?Appears to be somewhat stable. ? ?  ?  ? Bilateral lower extremity edema  ?  2D echo showed no evidence of CHF. ? ?Probably related to hypotension with poor arterial flow.  No evidence of DVT or venous reflux however I do not Sarah Garza was explained the edema.  For now we will continue to recommend.  Strader score stockings and foot elevation. ? ?  ?  ? ? ?=================================== ? ?HPI:   ? ?Sarah Garza is a 51 y.o. female with a PMH notable for multiple rheumatologic disorders including Sjogren's, RA, ITP along with chronic hypotension and hypoglycemia, who presents today for 93-month follow-up after initial evaluation of CHEST DISCOMFORT and Shortness of Breath/Dizziness. ? ?Sarah Garza had previously been seen by [facility] back in October 2022 for borderline hypotension with presyncope and palpitations-noting blood pressure dropping into the 80s over 40s.  She had been told to maintain adequate hydration and  thought to be somewhat dehydrated due to crepitus.  Has a chronic dizziness. ?=> She is also had multiple episodes of COVID infections.  By October 2022 she was able to exercise with walking and doing calisthenics with some exertional dyspnea but nothing significant.  Still had low blood pressures.  Felt to have idiopathic hypotension was not started on Florinef. ? ?I first saw her on October 17, 2021 for consultation at the request of Sarah Lass, MD. => this was in response today ER visit to [facility] with some chest discomfort, dyspnea and cough 3 weeks after her fourth round of COVID followed by flu. ?She noted chest pain mostly at rest describes a tightness across her entire chest with some focal sharp areas.  Once twice a day, not associate with exertion.  The episode leading her to the ER was prolonged.  He felt better with walking.  She also noted some exertional dyspnea and fatigue, intermittent and day edema (roughly 2-3+ pitting edema confirmed by pictures.  Swelling usually clears up the morning.  Also notices short-lived fluttering sensations intermittent dizziness. ?Check 2D echo, CPX and blood extremity venous reflux Dopplers. ? ?Recent Hospitalizations: None ? ?Reviewed  CV studies:   ? ?The following studies were reviewed today: (if available, images/films reviewed: From Epic Chart or Care Everywhere) ?VENOUS REFLUX 10/27/2021: No evidence of DVT in lower extremities bilaterally from common femoral through popliteal veins.  No DVT in the lower extremities bilaterally from the knees down.  No superficial venous thrombosis bilaterally.  No superficial venous reflux in the greater short saphenous veins bilaterally. ? ?ECHO 10/17/2021: EF 55 to 60%.  Normal LV function with no RWMA.  Normal diastolic parameters.  Normal RV with RVP.  Normal aortic and mitral valves.  Normal RAP. ? ?CPX was not done. ? ?Interval History:  ? ?Sarah Garza returns here today still noticing her  blood pressure being quite low as she says is often associated with hypoglycemia.  Trying to hydrate but still having a hard time.  We had recommended using support hose which seem to be helping a little bit but she is not wearing them all the time.  She is not necessarily having any chest pain.  She is has dizziness and lightheadedness but not have any passing out spells. ?No Heart failure symptoms of PND or orthopnea but does have some mild edema. ? ?CV Review of Symptoms (Summary) ?Cardiovascular ROS: positive for - -lightheadedness or dizziness, wooziness and near syncope.  Occasional palpitations ?negative for - chest pain, dyspnea on exertion, irregular heartbeat, loss of consciousness, orthopnea, paroxysmal nocturnal dyspnea, rapid heart rate, shortness of breath, or TIA/amaurosis fugax or claudication ? ?REVIEWED OF SYSTEMS  ? ?Review of Systems  ?Constitutional:  Positive for malaise/fatigue.  ?HENT:  Positive for congestion.   ?Respiratory:  Positive for shortness of breath (Stable).   ?Cardiovascular:  Positive for leg swelling.  ?     Per HPI  ?Gastrointestinal:  Negative for blood in stool and melena.  ?Genitourinary:  Negative for hematuria.  ?Musculoskeletal:  Positive for joint pain.  ?Neurological:  Positive for dizziness and weakness. Negative for focal weakness.  ?Psychiatric/Behavioral:  The patient is nervous/anxious.   Positive for malaise/fatigue. ? ?I have reviewed and (if needed) personally updated the patient's problem list, medications, allergies, past medical and surgical history, social and family history.  ? ?PAST MEDICAL HISTORY  ? ?Past Medical History:  ?Diagnosis Date  ? Arterial hypotension 10/17/2021  ? Diabetes mellitus without complication ([place])   ? ITP (idiopathic thrombocytopenic purpura)   ? Low back pain 02/15/2016  ? Rheumatoid aortitis   ? Sjoegren syndrome   ? Syncope   ? Likely related to hypotension  ? ? ?PAST SURGICAL HISTORY  ? ?Past Surgical History:  ?Procedure  Laterality Date  ? FOOT SURGERY    ? HERNIA REPAIR    ? TRANSTHORACIC ECHOCARDIOGRAM  10/17/2021  ? EF 55 to 60%.  Normal LV function with no RWMA.  Normal diastolic parameters.  Normal RV with RVP.  Normal aortic and mitral valves.  Normal RAP.  ? ? ? ?There is no immunization history on file for this patient. ? ?MEDICATIONS/ALLERGIES  ? ?Current Meds  ?Medication Sig  ? acetaminophen (TYLENOL) 500 MG tablet Take 500 mg by mouth every 6 (six) hours as needed for moderate pain or headache.  ? Biotin 5000 MCG TABS Take 5,000 mcg by mouth daily.  ? cyclobenzaprine (FLEXERIL) 5 MG tablet Take 5 mg by mouth daily.  ? hydroxychloroquine (PLAQUENIL) 200 MG tablet Take 200 mg by mouth daily.  ? midodrine (PROAMATINE) 2.5 MG tablet Take 1 tablet (2.5 mg total) by mouth daily. May take an additional 2.5 mg in the evening if needed daily  ? naproxen sodium (ALEVE) 220 MG tablet Take 220-440 mg by mouth daily as needed (cramps).  ? Propylene Glycol (SYSTANE BALANCE OP) Apply to eye.  ? Vitamin D, Ergocalciferol, (DRISDOL) 1.25 MG (50000 UNIT) CAPS capsule Take 50,000 Units by mouth every Friday.  ? ? ?Allergies  ?  Allergen Reactions  ? Cefdinir Shortness Of Breath and Diarrhea  ? Doxycycline Hyclate   ?  Other reaction(s): severe thrush when on doxycycline for a long time.  ? Gluten Meal   ? Levaquin [Levofloxacin] Other (See Comments)  ?  Joint pain  ? Mucinex [Guaifenesin Er] Other (See Comments)  ?  "Felt like I was going to pass out"  ? Penicillins Hives  ? Robaxin [Methocarbamol]   ?  Caused hotness, and nausea   ? ? ?SOCIAL HISTORY/FAMILY HISTORY  ? ?Reviewed in Epic:  ?Pertinent findings:  ?Social History  ? ?Tobacco Use  ? Smoking status: Never  ? Smokeless tobacco: Never  ?Vaping Use  ? Vaping Use: Never used  ?Substance Use Topics  ? Alcohol use: No  ? Drug use: No  ? ?Social History  ? ?Social History Narrative  ? Lives at home w/ her mother  ? Right-handed  ? Drinks 1 cup of coffee daily  ? ? ?OBJCTIVE -PE, EKG,  labs  ? ?Wt Readings from Last 3 Encounters:  ?12/21/21 118 lb 9.6 oz (53.8 kg)  ?11/02/21 119 lb (54 kg)  ?10/17/21 121 lb (54.9 kg)  ? ? ?Physical Exam: ?BP (!) 82/64   Pulse 69   Ht 5\' 4"  (1.626 m)   Wt 118 lb

## 2021-12-27 NOTE — Therapy (Signed)
?OUTPATIENT PHYSICAL THERAPY TREATMENT NOTE ? ? ?Patient Name: Sarah Garza ?MRN: [id number] ?DOB:1971-03-29, 51 y.o., female ?Today's Date: 12/28/2021 ? ?PCP: Kathyrn Lass, MD ?REFERRING PROVIDER: Penni Bombard, MD ? ? PT End of Session - 12/28/21 0920   ? ? Visit Number 5   ? Number of Visits 8   ? Date for PT Re-Evaluation 01/16/22   ? Authorization Type [employer]   ? PT Start Time [phone]   ? PT Stop Time 1002   ? PT Time Calculation (min) 44 min   ? Activity Tolerance Patient tolerated treatment well   ? Behavior During Therapy [facility] for tasks assessed/performed   ? ?  ?  ? ?  ? ? ? ? ?Past Medical History:  ?Diagnosis Date  ? Arterial hypotension 10/17/2021  ? Diabetes mellitus without complication ([place])   ? ITP (idiopathic thrombocytopenic purpura)   ? Low back pain 02/15/2016  ? Rheumatoid aortitis   ? Sjoegren syndrome   ? Syncope   ? Likely related to hypotension  ? ?Past Surgical History:  ?Procedure Laterality Date  ? FOOT SURGERY    ? HERNIA REPAIR    ? ?Patient Active Problem List  ? Diagnosis Date Noted  ? DOE (dyspnea on exertion) 10/17/2021  ? Chest pain of uncertain etiology 31/51/7616  ? Bilateral lower extremity edema 10/17/2021  ? Arterial hypotension 10/17/2021  ? Palpitations 04/16/2020  ? Cardiac murmur 04/16/2020  ? Low back pain 02/15/2016  ? ? ?REFERRING PROVIDER: Penni Bombard, MD ?  ?REFERRING DIAG: Chronic bilateral low back pain with left-sided sciatica ? ?THERAPY DIAG:  ?Chronic bilateral low back pain, unspecified whether sciatica present ? ?Muscle weakness (generalized) ? ?PERTINENT HISTORY: Sj?gren's syndrome ? ?PRECAUTIONS: None ? ?SUBJECTIVE: Patient states she was feeling really good but then she went to a wedding over the weekend and had to sit in a church pew which agitated her pain and now she is still tight. ? ?PAIN: ?Are you having pain? Yes ?NPRS scale: 3/10 ?Pain location: Back ?Pain orientation: Lower ?PAIN TYPE: Chronic ?Pain description: Constant, tightness, achy,  tender ?Aggravating factors: Sitting, walking, stand to sit ?Relieving factors: Stretching, change of positions, medication ? ?PATIENT GOALS: Pain relief and improve walking and return to prior activity level ? ? ?OBJECTIVE:  ?PATIENT SURVEYS:  ?FOTO 50% functional status ?  ?SENSATION: ?Light touch: Patient reports sensation changes of bilateral feel, generalized; no specific dermatomal distribution ?  ?MUSCLE LENGTH: ?Hamstring flexibility limited bilaterally ?Quad and hip flexor flexibility limited bilaterally ?Piriformis flexibility limited bilaterally ?  ?POSTURE:  ?Rounded shoulders, forward head, slender frame ?  ?PALPATION: ?Tenderness to bilateral lumbar paraspinals, bilateral greater trochanter and gluteal regions ?  ?LUMBAR AROM ?  ?AROM A/PROM  ?11/21/2021  ?Flexion 50% - knee bend indicating hamstring tightness  ?Extension 50% - spinal pain reported  ?Right lateral flexion 75% - contralateral hip pain  ?Left lateral flexion 75% - contralateral hip pain  ?Right rotation 50% - bilateral hip pain  ?Left rotation 50%- bilateral hip pain  ? ?LE MMT: ?  ?MMT Right ?11/21/2021 Left ?11/21/2021  ?12/20/2021  ?Core 4- 4-  ?Hip flexion 4 4   ?Hip extension 4- 4-   ?Hip abduction 4- 4-   ?Knee flexion 5 5   ?Knee extension 5 5   ? ?FUNCTIONAL TESTS:  ?Sit to stand: patient able to perform sit to stand without assist  - 12/28/2021 ?  ?GAIT: ?Assistive device utilized: None ?Level of assistance: Complete Independence ?Comments: slow/hesitant gait ?  ?  ?  TODAY'S TREATMENT  ?[facility] Adult PT Treatment:                                                DATE: 12/28/2021 ?Therapeutic Exercise: ?NuStep L7 x 5 min with UE/LE while taking subjective ?LTR in figure-4 position 5 x 5 sec each ?Piriformis stretch x 30 sec each ?Sidelying thoracic rotation x 10 sec each ?Child's pose 2 x 15 sec, x 15 sec each ?Quadruped thoracolumbar rotation HBH x 5 each ?1/2 kneeling hip flexor/quad stretch 2 x 20 sec each ?Bridge 10 x 5 sec ?Figure-4 bridge  5 x 5 sec each ?Modified side plank with clamshell 2 x 10 each ?Modified front plank on knees 2 x 20 sec ?Manual: ?Skilled palpation and monitoring of muscle tension while performing TPDN treatment ?Trigger Point Dry Needling Treatment: ?Pre-treatment instruction: Patient instructed on dry needling rationale, procedures, and possible side effects including pain during treatment (achy,cramping feeling), bruising, drop of blood, lightheadedness, nausea, sweating. ?Patient Consent Given: Yes ?Education handout provided: No ?Muscles treated: bilateral lumbar multifidi  ?Needle size and number: .30x85mm x 4 ?Electrical stimulation performed: Yes ?Parameters:  Milli, 2 frequency, intensity to patient tolerance  x 10 minutes ?Treatment response/outcome: Palpable decrease in muscle tension and patient reporting improved tightness ?Post-treatment instructions: Patient instructed to expect possible mild to moderate muscle soreness later today and/or tomorrow. Patient instructed in methods to reduce muscle soreness and to continue prescribed HEP. If patient was dry needled over the lung field, patient was instructed on signs and symptoms of pneumothorax and, however unlikely, to see immediate medical attention should they occur. Patient was also educated on signs and symptoms of infection and to seek medical attention should they occur. Patient verbalized understanding of these instructions and education. ? ? ?[facility] Adult PT Treatment:                                                DATE: 12/20/2021 ?Therapeutic Exercise: ?NuStep L7 x 5 min with UE/LE while taking subjective ?LTR in figure-4 position 5 x 5 sec each ?Piriformis stretch 2 x 20 sec each ?Sidelying thoracic rotation 5 x 5 sec each ?1/2 kneeling hip flexor/quad stretch 2 x 20 sec each ?Bridge 10 x 5 sec ?Figure-4 bridge 5 x 5 sec each ?Modified side plank with clamshell 2 x 10 each ?Modified front plank on knees 2 x 20 sec ? ?[facility] Adult PT Treatment:                                                 DATE: 12/16/2021 ?Therapeutic Exercise: ?Seated - modified childs pose for work option- laterals x 1 each- pt likes this stretch ?Seated pelvic tilts ?Seated figure 4 push and pull  ?NuStep L5 x 5 min with UE/LE while taking subjective ?Bridge 10 x 3 sec articulating  ?Ppt x 10 ?SKTC ?Figure 4 push and pull ?Sidelying thoracic rotation x 10 each ?  ?PATIENT EDUCATION:  ?Education details: HEP, TPDN ?Person educated: Patient ?Education method: Explanation, Demonstration, Tactile cues, Verbal cues ?Education comprehension: verbalized understanding, returned demonstration, verbal cues required,  tactile cues required, and needs further education ?  ?HOME EXERCISE PROGRAM: ?Access Code: [email or web address] ? ?  ?ASSESSMENT: ?CLINICAL IMPRESSION: ?Patient tolerated therapy well with no adverse effects. Performed TPDN with e-stim this visit to reduce lumbar muscle tension and tightness. Patient seemed to respond well to treatment. Therapy continues to focus primarily on progressing spinal mobility and stretching to reduce tightness. She continues to report main symptoms of tightness and pain bilateral lumbar and hip regions. No changes to HEP this visit. She would benefit from continued skilled PT to progress mobility and strength in order to reduce pain, improve walking, and maximize functional ability.  ?  ?  ?OBJECTIVE IMPAIRMENTS Abnormal gait, decreased activity tolerance, decreased ROM, decreased strength, impaired flexibility, impaired sensation, improper body mechanics, postural dysfunction, and pain.  ?  ?ACTIVITY LIMITATIONS cleaning, community activity, driving, meal prep, occupation, laundry, yard work, and shopping.  ?  ?PERSONAL FACTORS Fitness, Past/current experiences, and Time since onset of injury/illness/exacerbation are also affecting patient's functional outcome.  ?  ?  ?GOALS: ?Goals reviewed with patient? Yes ?  ?SHORT TERM GOALS: ?  ?Patient will be I with initial HEP in order to  progress with therapy. ?Baseline: provided at evaluation ?12/20/2021: independent ?Target date: 12/19/2021 ?Goal status: MET ?  ?2.  PT will review FOTO with patient by 3rd visit in order to understand expe

## 2021-12-28 ENCOUNTER — Encounter: Payer: Self-pay | Admitting: Physical Therapy

## 2021-12-28 ENCOUNTER — Other Ambulatory Visit: Payer: Self-pay

## 2021-12-28 ENCOUNTER — Ambulatory Visit: Payer: BC Managed Care – PPO | Admitting: Physical Therapy

## 2021-12-28 DIAGNOSIS — M545 Low back pain, unspecified: Secondary | ICD-10-CM | POA: Diagnosis not present

## 2021-12-28 DIAGNOSIS — G8929 Other chronic pain: Secondary | ICD-10-CM | POA: Diagnosis not present

## 2021-12-28 DIAGNOSIS — M6281 Muscle weakness (generalized): Secondary | ICD-10-CM | POA: Diagnosis not present

## 2021-12-28 NOTE — Telephone Encounter (Signed)
Patient was seen on 12/21/21 by Dr Herbie Baltimore ?

## 2022-01-02 DIAGNOSIS — R21 Rash and other nonspecific skin eruption: Secondary | ICD-10-CM | POA: Diagnosis not present

## 2022-01-02 DIAGNOSIS — M5440 Lumbago with sciatica, unspecified side: Secondary | ICD-10-CM | POA: Diagnosis not present

## 2022-01-02 DIAGNOSIS — D696 Thrombocytopenia, unspecified: Secondary | ICD-10-CM | POA: Diagnosis not present

## 2022-01-02 DIAGNOSIS — M3500 Sicca syndrome, unspecified: Secondary | ICD-10-CM | POA: Diagnosis not present

## 2022-01-02 NOTE — Therapy (Addendum)
OUTPATIENT PHYSICAL THERAPY TREATMENT NOTE  DISCHARGE   Patient Name: Sarah Garza MRN: [id number] DOB:[birth year], 51 y.o., female Today's Date: 01/04/2022  PCP: Sigmund Hazel, MD REFERRING PROVIDER: Suanne Marker, MD   PT End of Session - 01/04/22 [id number]     Visit Number 6    Number of Visits 8    Date for PT Re-Evaluation 01/16/22    Authorization Type [employer]    PT Start Time 0830    PT Stop Time 0910    PT Time Calculation (min) 40 min    Activity Tolerance Patient tolerated treatment well    Behavior During Therapy [facility] for tasks assessed/performed                Past Medical History:  Diagnosis Date   Arterial hypotension 10/17/2021   Diabetes mellitus without complication (HCC)    ITP (idiopathic thrombocytopenic purpura)    Low back pain 02/15/2016   Rheumatoid aortitis    Sjoegren syndrome    Syncope    Likely related to hypotension   Past Surgical History:  Procedure Laterality Date   FOOT SURGERY     HERNIA REPAIR     Patient Active Problem List   Diagnosis Date Noted   DOE (dyspnea on exertion) 10/17/2021   Chest pain of uncertain etiology 10/17/2021   Bilateral lower extremity edema 10/17/2021   Arterial hypotension 10/17/2021   Palpitations 04/16/2020   Cardiac murmur 04/16/2020   Low back pain 02/15/2016    REFERRING PROVIDER: Suanne Marker, MD   REFERRING DIAG: Chronic bilateral low back pain with left-sided sciatica  THERAPY DIAG:  Chronic bilateral low back pain, unspecified whether sciatica present  Muscle weakness (generalized)  PERTINENT HISTORY: Sjgren's syndrome  PRECAUTIONS: None  SUBJECTIVE: Patient reports she feels like her symptoms are mainly related to doing more activity.   PAIN: Are you having pain? Yes NPRS scale: 3/10 Pain location: Back Pain orientation: Lower PAIN TYPE: Chronic Pain description: Constant, tightness, achy, tender Aggravating factors: Sitting, walking, stand to sit Relieving  factors: Stretching, change of positions, medication  PATIENT GOALS: Pain relief and improve walking and return to prior activity level   OBJECTIVE:  PATIENT SURVEYS:  FOTO 60% functional status - 01/04/2022 (50% at intake)   SENSATION: Light touch: Patient reports sensation changes of bilateral feel, generalized; no specific dermatomal distribution   MUSCLE LENGTH: Hamstring flexibility limited bilaterally Quad and hip flexor flexibility limited bilaterally Piriformis flexibility limited bilaterally   POSTURE:  Rounded shoulders, forward head, slender frame   PALPATION: Tenderness to bilateral lumbar paraspinals, bilateral greater trochanter and gluteal regions   LUMBAR AROM   AROM A/PROM  11/21/2021  Flexion 50% - knee bend indicating hamstring tightness  Extension 50% - spinal pain reported  Right lateral flexion 75% - contralateral hip pain  Left lateral flexion 75% - contralateral hip pain  Right rotation 50% - bilateral hip pain  Left rotation 50%- bilateral hip pain   LE MMT:   MMT Right 11/21/2021 Left 11/21/2021  12/20/2021  Core 4- 4-  Hip flexion 4 4   Hip extension 4- 4-   Hip abduction 4- 4-   Knee flexion 5 5   Knee extension 5 5    FUNCTIONAL TESTS:  Sit to stand: patient able to perform sit to stand without assist  - 12/28/2021   GAIT: Assistive device utilized: None Level of assistance: Complete Independence Comments: slow/hesitant gait     TODAY'S TREATMENT  [facility] Adult PT Treatment:  DATE: 01/04/2022 Therapeutic Exercise: NuStep L7 x 5 min with UE/LE while taking subjective LTR in figure-4 position x 10 each Sidelying thoracic rotation x 10 each Cat cow x 10 1/2 kneeling hip flexor/quad stretch 5 x 5 sec each Figure-4 bridge 10 x 5 sec each 90-90 alternating heel tap 2 x 10 Modified side plank and clamshell with yellow 2 x 10 each Modified front plank on knees 3 x 20 sec Deadlift 30# 2 x  10   [facility] Adult PT Treatment:                                                DATE: 12/28/2021 Therapeutic Exercise: NuStep L7 x 5 min with UE/LE while taking subjective LTR in figure-4 position 5 x 5 sec each Piriformis stretch x 30 sec each Sidelying thoracic rotation x 10 sec each Child's pose 2 x 15 sec, x 15 sec each Quadruped thoracolumbar rotation HBH x 5 each 1/2 kneeling hip flexor/quad stretch 2 x 20 sec each Bridge 10 x 5 sec Figure-4 bridge 5 x 5 sec each Modified side plank with clamshell 2 x 10 each Modified front plank on knees 2 x 20 sec Manual: Skilled palpation and monitoring of muscle tension while performing TPDN treatment Trigger Point Dry Needling Treatment: Pre-treatment instruction: Patient instructed on dry needling rationale, procedures, and possible side effects including pain during treatment (achy,cramping feeling), bruising, drop of blood, lightheadedness, nausea, sweating. Patient Consent Given: Yes Education handout provided: No Muscles treated: bilateral lumbar multifidi  Needle size and number: .30x55mm x 4 Electrical stimulation performed: Yes Parameters:  Milli, 2 frequency, intensity to patient tolerance  x 10 minutes Treatment response/outcome: Palpable decrease in muscle tension and patient reporting improved tightness Post-treatment instructions: Patient instructed to expect possible mild to moderate muscle soreness later today and/or tomorrow. Patient instructed in methods to reduce muscle soreness and to continue prescribed HEP. If patient was dry needled over the lung field, patient was instructed on signs and symptoms of pneumothorax and, however unlikely, to see immediate medical attention should they occur. Patient was also educated on signs and symptoms of infection and to seek medical attention should they occur. Patient verbalized understanding of these instructions and education.  [facility] Adult PT Treatment:                                                 DATE: 12/20/2021 Therapeutic Exercise: NuStep L7 x 5 min with UE/LE while taking subjective LTR in figure-4 position 5 x 5 sec each Piriformis stretch 2 x 20 sec each Sidelying thoracic rotation 5 x 5 sec each 1/2 kneeling hip flexor/quad stretch 2 x 20 sec each Bridge 10 x 5 sec Figure-4 bridge 5 x 5 sec each Modified side plank with clamshell 2 x 10 each Modified front plank on knees 2 x 20 sec   PATIENT EDUCATION:  Education details: HEP Person educated: Patient Education method: Consulting civil engineer, Demonstration, Corporate treasurer cues, Verbal cues Education comprehension: verbalized understanding, returned demonstration, verbal cues required, tactile cues required, and needs further education   HOME EXERCISE PROGRAM: Access Code: [email or web address]    ASSESSMENT: CLINICAL IMPRESSION: Patient tolerated therapy well with no adverse effects. She reported minimal benefit from [facility] last visit  so did not perform today. Therapy continues to focus on progression of mobility and strength, incorporating lifting this visit with good tolerance. Patient did require cueing for proper lifting technique and maintain core activation for lumbopelvic control. No changes to HEP this visit. She would benefit from continued skilled PT to progress mobility and strength in order to reduce pain, improve walking, and maximize functional ability.      OBJECTIVE IMPAIRMENTS Abnormal gait, decreased activity tolerance, decreased ROM, decreased strength, impaired flexibility, impaired sensation, improper body mechanics, postural dysfunction, and pain.    ACTIVITY LIMITATIONS cleaning, community activity, driving, meal prep, occupation, laundry, yard work, and shopping.    PERSONAL FACTORS Fitness, Past/current experiences, and Time since onset of injury/illness/exacerbation are also affecting patient's functional outcome.      GOALS: Goals reviewed with patient? Yes   SHORT TERM GOALS:   Patient will be I with initial  HEP in order to progress with therapy. Baseline: provided at evaluation 12/20/2021: independent Target date: 12/19/2021 Goal status: MET   2.  PT will review FOTO with patient by 3rd visit in order to understand expected progress and outcome with therapy. Baseline: assessed at evaluation 12/20/2021: reviewed Target date: 12/19/2021 Goal status: MET   3.  Patient will report pain level no more than </= 5/10 with activity in order to reduce functional limitations. Baseline: 7/10 pain level 12/20/2021: 5/10 Target date: 12/19/2021 Goal status: MET     LONG TERM GOALS:   Patient will be I with final HEP to maintain progress from PT. Baseline: provided at eval Target date: 01/16/2022 Goal status: INITIAL   2.  Patient will report >/= 62% status on FOTO to indicate improved functional ability. Baseline: 50% functional status 01/04/2022: 60% Target date: 01/16/2022 Goal status: ONGOING   3.  Patient will demonstrate 25% improvement in lumbar motion in all directions without increase in pain level in order to improve ability to perform household tasks Baseline: patient demonstrates gross lumbar AROM limitations (see above) Target date: 01/16/2022 Goal status: INITIAL   4.  Patient will exhibit core and hip strength grossly >/= 4/5 MMT in order to improve walking ability and return to prior activity level Baseline: core and hip strength grossly 4-/5 MMT Target date: 01/16/2022 Goal status: INITIAL     PLAN: PT FREQUENCY: 1x/week   PT DURATION: 8 weeks   PLANNED INTERVENTIONS: Therapeutic exercises, Therapeutic activity, Neuromuscular re-education, Balance training, Gait training, Patient/Family education, Joint manipulation, Joint mobilization, Aquatic Therapy, Dry Needling, Electrical stimulation, Spinal manipulation, Spinal mobilization, Cryotherapy, Moist heat, Taping, and Manual therapy   PLAN FOR NEXT SESSION: Review HEP and progress PRN, continue with lumbar/LE stretching, manual/dry  needling as needed for lumbar/hip mobility, progress core/hip strengthening as tolerated     Hilda Blades, PT, DPT, LAT, ATC 01/04/22  9:16 AM Phone: [phone] Fax: [phone]      PHYSICAL THERAPY DISCHARGE SUMMARY  Visits from Start of Care: 6  Current functional level related to goals / functional outcomes: See above   Remaining deficits: See above   Education / Equipment: HEP   Patient agrees to discharge. Patient goals were partially met. Patient is being discharged due to not returning since the last visit.  Hilda Blades, PT, DPT, LAT, ATC 03/07/22  10:20 AM Phone: [phone] Fax: [phone]

## 2022-01-04 ENCOUNTER — Ambulatory Visit: Payer: BC Managed Care – PPO | Admitting: Physical Therapy

## 2022-01-04 ENCOUNTER — Encounter: Payer: Self-pay | Admitting: Physical Therapy

## 2022-01-04 ENCOUNTER — Other Ambulatory Visit: Payer: Self-pay

## 2022-01-04 DIAGNOSIS — M6281 Muscle weakness (generalized): Secondary | ICD-10-CM | POA: Diagnosis not present

## 2022-01-04 DIAGNOSIS — G8929 Other chronic pain: Secondary | ICD-10-CM | POA: Diagnosis not present

## 2022-01-04 DIAGNOSIS — M545 Low back pain, unspecified: Secondary | ICD-10-CM

## 2022-01-10 ENCOUNTER — Ambulatory Visit: Payer: BC Managed Care – PPO | Admitting: Physical Therapy

## 2022-01-13 ENCOUNTER — Ambulatory Visit: Payer: BC Managed Care – PPO | Admitting: Physical Therapy

## 2022-01-30 ENCOUNTER — Encounter: Payer: Self-pay | Admitting: Cardiology

## 2022-01-30 NOTE — Assessment & Plan Note (Signed)
Blood pressures are still very low.  Amazingly she is able to walk in here without difficulty.  This clearly explains her dizziness. ? ?Plan: ?? We will start midodrine 2.5 mg in the morning and take if still dizzy. ?? Continue to recommend adequate hydration with hydration Promus. ?? Liberalize salt intake. ?? Continue support stockings and leg elevation especially with edema. ?

## 2022-01-30 NOTE — Assessment & Plan Note (Signed)
2D echo showed no evidence of CHF. ? ?Probably related to hypotension with poor arterial flow.  No evidence of DVT or venous reflux however I do not Azalee Course was explained the edema.  For now we will continue to recommend.  Strader score stockings and foot elevation. ?

## 2022-01-30 NOTE — Assessment & Plan Note (Signed)
She never went for CPX.  Still ordered.  Echo was normal. ? ?Appears to be somewhat stable. ?

## 2022-02-03 DIAGNOSIS — J019 Acute sinusitis, unspecified: Secondary | ICD-10-CM | POA: Diagnosis not present

## 2022-02-03 DIAGNOSIS — R300 Dysuria: Secondary | ICD-10-CM | POA: Diagnosis not present

## 2022-02-10 ENCOUNTER — Encounter: Payer: Self-pay | Admitting: Cardiology

## 2022-02-10 NOTE — Telephone Encounter (Signed)
Patient reports her PCP stopped all of her medications because she was placed on doxycycline. Her bp this am is 94/62, p 66. She also reports her left hamstring from buttock to knee gets numb; she thinks because she "pulled out my lower back" when stretching months ago. She had an MRI and has gone to PT. I conferred with pharmD C. Pavero, who said patient can take midodrine. Patient advised of this and voiced understanding.

## 2022-02-14 DIAGNOSIS — R42 Dizziness and giddiness: Secondary | ICD-10-CM | POA: Diagnosis not present

## 2022-02-14 DIAGNOSIS — N390 Urinary tract infection, site not specified: Secondary | ICD-10-CM | POA: Diagnosis not present

## 2022-02-14 DIAGNOSIS — I959 Hypotension, unspecified: Secondary | ICD-10-CM | POA: Diagnosis not present

## 2022-02-14 DIAGNOSIS — M3500 Sicca syndrome, unspecified: Secondary | ICD-10-CM | POA: Diagnosis not present

## 2022-02-23 DIAGNOSIS — R7309 Other abnormal glucose: Secondary | ICD-10-CM | POA: Diagnosis not present

## 2022-02-23 DIAGNOSIS — I959 Hypotension, unspecified: Secondary | ICD-10-CM | POA: Diagnosis not present

## 2022-03-08 DIAGNOSIS — R7309 Other abnormal glucose: Secondary | ICD-10-CM | POA: Diagnosis not present

## 2022-04-14 DIAGNOSIS — Z0000 Encounter for general adult medical examination without abnormal findings: Secondary | ICD-10-CM | POA: Diagnosis not present

## 2022-04-17 DIAGNOSIS — K5900 Constipation, unspecified: Secondary | ICD-10-CM | POA: Diagnosis not present

## 2022-04-17 DIAGNOSIS — D696 Thrombocytopenia, unspecified: Secondary | ICD-10-CM | POA: Diagnosis not present

## 2022-04-17 DIAGNOSIS — K8020 Calculus of gallbladder without cholecystitis without obstruction: Secondary | ICD-10-CM | POA: Diagnosis not present

## 2022-04-17 DIAGNOSIS — R1013 Epigastric pain: Secondary | ICD-10-CM | POA: Diagnosis not present

## 2022-04-17 DIAGNOSIS — K219 Gastro-esophageal reflux disease without esophagitis: Secondary | ICD-10-CM | POA: Diagnosis not present

## 2022-04-19 ENCOUNTER — Telehealth: Payer: Self-pay | Admitting: Physician Assistant

## 2022-04-19 NOTE — Telephone Encounter (Signed)
Scheduled appt per 8/2 referral. Pt is aware of appt date and time. Pt is aware to arrive 15 mins prior to appt time and to bring and updated insurance card. Pt is aware of appt location.   

## 2022-05-01 ENCOUNTER — Telehealth: Payer: Self-pay | Admitting: Cardiology

## 2022-05-01 ENCOUNTER — Encounter: Payer: Self-pay | Admitting: Cardiology

## 2022-05-01 NOTE — Telephone Encounter (Signed)
Pt sent this Via MyChart to the scheduling pool:        Yes, dizzy now and definitely have felt faint. Not passed out.  Shaky. Just had "episode" of rapid pulse standing still. I can breath but it's different. Just checked it. 90/64, pulse 85. Legs are strange. When I walk, it sometimes feels like I don't have control of them. Like they're too heavy or the opposite, too light so they want to do something else when I try to stop.     Good Morning Sarah Garza Can you tell me a little more about you Dizziness?    Are you dizzy now?   Do you feel faint or have you passed out?   Do you have any other symptoms?   Have you checked your HR and BP (record if available)?      Appointment Request From: Sarah Garza  With Provider: Bryan Lemma, MD [facility]]  Preferred Date Range: Any  Preferred Times: Any Time  Reason for visit: Office Visit  Comments: Legs are heavy, wobbly, light. Increased dizzyness and tripping.  "Episodes" of increased pulse when standing still and a flushing of heat, legs burning, etc.

## 2022-05-01 NOTE — Telephone Encounter (Signed)
Patient complaining of dizziness and legs feeling "heavy" at time and "light" at time. She wears her compression hose reguarlay except when laying down. This dizziness started on Thursday when working and thought she had been standing to long. It had started to get better but today she feels a bit worse.  When she was walking to the bathroom this morning to brush teeth became SOB and felt like her HR was high for awhile. It finally got better after about 20 minutes and it has not happened since. She has no complaints of chest pain.  She is having leg swelling when does not have on her compression socks but she states it usually gets better when she wakes in the morning.    BP 84/65 usually and today 90/64, has not taken her midodrine yet. Instructed to take her medication and per instructions can take an additional dose in the PM. She agree with plan and requested a f/u appt. Provided appt. With cleaver on Friday 8/18 @ 2:20p to evaluate if there are additional changes to be made.. Patient asked that she call us if taking the additional dose does not help. She should also check her BP regularly to see if there increase in BP is helping.   She drinks plenty of water (keeps a log) and 8-24 oz of Gatorade daily.

## 2022-05-01 NOTE — Telephone Encounter (Signed)
See phone note

## 2022-05-04 NOTE — Progress Notes (Signed)
Cardiology Clinic Note   Patient Name: Sarah Garza Date of Encounter: 05/05/2022  Primary Care Provider:  Sigmund Hazel, MD Primary Cardiologist:  Bryan Lemma, MD  Patient Profile    Sarah Garza 51 year old female presents to the clinic today for follow-up evaluation of her palpitations.  Past Medical History    Past Medical History:  Diagnosis Date   Arterial hypotension 10/17/2021   Diabetes mellitus without complication (HCC)    ITP (idiopathic thrombocytopenic purpura)    Low back pain 02/15/2016   Rheumatoid aortitis    Sjoegren syndrome    Syncope    Likely related to hypotension   Past Surgical History:  Procedure Laterality Date   FOOT SURGERY     HERNIA REPAIR     TRANSTHORACIC ECHOCARDIOGRAM  10/17/2021   EF 55 to 60%.  Normal LV function with no RWMA.  Normal diastolic parameters.  Normal RV with RVP.  Normal aortic and mitral valves.  Normal RAP.    Allergies  Allergies  Allergen Reactions   Cefdinir Shortness Of Breath and Diarrhea   Doxycycline Hyclate     Other reaction(s): severe thrush when on doxycycline for a long time.   Gluten Meal    Levaquin [Levofloxacin] Other (See Comments)    Joint pain   Mucinex [Guaifenesin Er] Other (See Comments)    "Felt like I was going to pass out"   Penicillins Hives   Robaxin [Methocarbamol]     Caused hotness, and nausea     History of Present Illness    Sarah ERNANDEZ has a PMH of atrial hypertension, cardiac murmur, palpitations, DOE, and bilateral lower extremity edema.  She was seen in follow-up by Dr. Herbie Baltimore on 12/21/2021.  During the interim her blood pressure continued to be low.  She reported dizziness.  She was started on midodrine 2.5 mg in the a.m. for symptoms and dizziness.  She was instructed to liberalize her salt and continue to wear lower extremity support stockings.  Her echocardiogram was noted to be normal.  She contacted the nurse triage line on 05/01/2022.  She  indicated that she had weakness in her legs and felt dizzy.  She indicated some lower extremity pain but denied redness.  She reported that when she would remove her compression stockings she would immediately feel rushing going to her lower extremities and heaviness.  She also reported episodes of forgetting what she was talking about.  Her blood pressure was 90/64 and she had not yet taken her midodrine.  She presents to the clinic today for follow-up evaluation and states she contracted COVID 5 times.  With her final episode of COVID she developed an infection and was prescribed doxycycline.  She has had struggles with constipation since that time as well as blood pressure issues.  She reports that she used to be very physically active and is now limited in her physical activity due to her symptoms.  She also notes chronic constipation which she takes stool softener and laxatives for.  She reports that she has increased the fiber in her diet and drinks close to 100 ounces of fluid daily.  She supplements her hydration with electrolyte drinks as well.  She was worried about decreased sodium.  She continues to note decreased blood pressure later on today.  She also reports that she was at her primary care doctor and was noted to have cerumen buildup.  She had cerumen removed and noted increased dizziness with the procedure.  She  will be seen ENT on August 28.  She will also be seen by GI for colonoscopy and EGD in October.  I will increase her midodrine to twice daily, have her maintain her hydration and electrolyte supplementation, increase supine/recumbent type activities, continue lower extremity support stockings and plan follow-up in 2 to 3 months.  Today she denies chest pain, shortness of breath, lower extremity edema, fatigue, palpitations, melena, hematuria, hemoptysis, diaphoresis, weakness, presyncope, syncope, orthopnea, and PND.     Home Medications    Prior to Admission medications    Medication Sig Start Date End Date Taking? Authorizing Provider  acetaminophen (TYLENOL) 500 MG tablet Take 500 mg by mouth every 6 (six) hours as needed for moderate pain or headache.    [provider]  Biotin 5000 MCG TABS Take 5,000 mcg by mouth daily.    [provider]  clonazePAM (KLONOPIN) 0.5 MG tablet Take 0.5 tablets (0.25 mg total) by mouth at bedtime. Patient not taking: Reported on 12/21/2021 12/01/21   Penumalli, Glenford Bayley, MD  cyclobenzaprine (FLEXERIL) 5 MG tablet Take 5 mg by mouth daily. 11/23/21   [provider]  hydroxychloroquine (PLAQUENIL) 200 MG tablet Take 200 mg by mouth daily.    [provider]  midodrine (PROAMATINE) 2.5 MG tablet Take 1 tablet (2.5 mg total) by mouth daily. May take an additional 2.5 mg in the evening if needed daily 12/21/21   Marykay Lex, MD  naproxen sodium (ALEVE) 220 MG tablet Take 220-440 mg by mouth daily as needed (cramps).    [provider]  Propylene Glycol (SYSTANE BALANCE OP) Apply to eye.    [provider]  Vitamin D, Ergocalciferol, (DRISDOL) 1.25 MG (50000 UNIT) CAPS capsule Take 50,000 Units by mouth every Friday. 03/25/20   [provider]    Family History    Family History  Problem Relation Age of Onset   Hypothyroidism Mother    COPD Father    Cirrhosis Father    Valvular heart disease Father        S/p MVR   Sjogren's syndrome Sister    Rheum arthritis Sister    Heart attack Maternal Grandfather    CAD Maternal Grandfather    She indicated that her mother is alive. She indicated that her father is deceased. She indicated that only one of her two sisters is alive. She indicated that her maternal grandmother is deceased. She indicated that her maternal grandfather is deceased. She indicated that her paternal grandmother is deceased. She indicated that her paternal grandfather is deceased.  Social History    Social History   Socioeconomic History   Marital  status: Single    Spouse name: Not on file   Number of children: 0   Years of education: 16   Highest education level: Not on file  Occupational History   Not on file  Tobacco Use   Smoking status: Never   Smokeless tobacco: Never  Vaping Use   Vaping Use: Never used  Substance and Sexual Activity   Alcohol use: No   Drug use: No   Sexual activity: Not on file  Other Topics Concern   Not on file  Social History Narrative   Lives at home w/ her mother   Right-handed   Drinks 1 cup of coffee daily   Social Determinants of Health   Financial Resource Strain: Not on file  Food Insecurity: Not on file  Transportation Needs: Not on file  Physical Activity: Not on  file  Stress: Not on file  Social Connections: Not on file  Intimate Partner Violence: Not on file     Review of Systems    General:  No chills, fever, night sweats or weight changes.  Cardiovascular:  No chest pain, dyspnea on exertion, edema, orthopnea, palpitations, paroxysmal nocturnal dyspnea. Dermatological: No rash, lesions/masses Respiratory: No cough, dyspnea Urologic: No hematuria, dysuria Abdominal:   No nausea, vomiting, diarrhea, bright red blood per rectum, melena, or hematemesis Neurologic:  No visual changes, wkns, changes in mental status. All other systems reviewed and are otherwise negative except as noted above.  Physical Exam    VS:  BP 92/60   Pulse 73   Ht 5\' 4"  (1.626 m)   Wt 115 lb 12.8 oz (52.5 kg)   SpO2 100%   BMI 19.88 kg/m  , BMI Body mass index is 19.88 kg/m. GEN: Well nourished, well developed, in no acute distress. HEENT: normal. Neck: Supple, no JVD, carotid bruits, or masses. Cardiac: RRR, no murmurs, rubs, or gallops. No clubbing, cyanosis, edema.  Radials/DP/PT 2+ and equal bilaterally.  Respiratory:  Respirations regular and unlabored, clear to auscultation bilaterally. GI: Soft, nontender, nondistended, BS + x 4. MS: no deformity or atrophy. Skin: warm and dry, no  rash. Neuro:  Strength and sensation are intact. Psych: Normal affect.  Accessory Clinical Findings    Recent Labs: 10/02/2021: BUN 20; Creatinine, Ser 0.79; Hemoglobin 11.0; Platelets 102; Potassium 3.9; Sodium 140   Recent Lipid Panel No results found for: "CHOL", "TRIG", "HDL", "CHOLHDL", "VLDL", "LDLCALC", "LDLDIRECT"  ECG personally reviewed by me today-none today.  Echocardiogram 10/17/2021 IMPRESSIONS     1. Left ventricular ejection fraction, by estimation, is 55 to 60%. Left  ventricular ejection fraction by 3D volume is 55 %. The left ventricle has  normal function. The left ventricle has no regional wall motion  abnormalities. Left ventricular diastolic   parameters were normal.   2. Right ventricular systolic function is normal. The right ventricular  size is normal. There is normal pulmonary artery systolic pressure.   3. The mitral valve is normal in structure. No evidence of mitral valve  regurgitation. No evidence of mitral stenosis.   4. The aortic valve is tricuspid. Aortic valve regurgitation is not  visualized. No aortic stenosis is present.   5. The inferior vena cava is normal in size with greater than 50%  respiratory variability, suggesting right atrial pressure of 3 mmHg.   Comparison(s): No prior Echocardiogram.  Assessment & Plan   1.  Dizziness-continues to have dizziness throughout the day.  Reports that she is drinking close to 100 ounces daily.  She has added Gatorade to her hydration to prevent hyponatremia. Maintain p.o. hydration Lower extremity support stockings May increase midodrine to twice daily Increase rowing activities and recumbent type activities Follow-up with ENT and GI  Palpitations-heart rate today 73.  Does note palpitations with getting up in the morning occasionally, palpitations dissipate on their own without intervention in Change positions slowly Increase physical activity as tolerated Maintain p.o. hydration  DOE-was  noted to have normal echocardiogram.  No increased DOE or activity intolerance.  Chest x-ray previously recommended. Does not appear to be cardiac related, patient reassured  Disposition: Follow-up with Dr. 10/19/2021 in 3 months.   Herbie Baltimore. Byanka Landrus NP-C     05/05/2022, 2:32 PM [facility] [address] Office [phone] Fax [phone]  Notice: This dictation was prepared with Dragon dictation along with smaller  Company secretary. Any transcriptional errors that result from this process are unintentional and may not be corrected upon review.  I spent 14 minutes examining this patient, reviewing medications, and using patient centered shared decision making involving her cardiac care.  Prior to her visit I spent greater than 20 minutes reviewing her past medical history,  medications, and prior cardiac tests.

## 2022-05-05 ENCOUNTER — Ambulatory Visit: Payer: BC Managed Care – PPO | Admitting: General Practice

## 2022-05-05 ENCOUNTER — Encounter: Payer: Self-pay | Admitting: General Practice

## 2022-05-05 VITALS — BP 92/60 | HR 73 | Ht 64.0 in | Wt 115.8 lb

## 2022-05-05 DIAGNOSIS — R0609 Other forms of dyspnea: Secondary | ICD-10-CM | POA: Diagnosis not present

## 2022-05-05 DIAGNOSIS — R002 Palpitations: Secondary | ICD-10-CM

## 2022-05-05 DIAGNOSIS — R42 Dizziness and giddiness: Secondary | ICD-10-CM | POA: Diagnosis not present

## 2022-05-05 MED ORDER — MIDODRINE HCL 2.5 MG PO TABS: 2.5000 mg | ORAL_TABLET | Freq: Two times a day (BID) | ORAL | 6 refills | Status: DC

## 2022-05-05 NOTE — Patient Instructions (Addendum)
Medication Instructions:  TAKE MIDODRINE AM AND AT 2PM  *If you need a refill on your cardiac medications before your next appointment, please call your pharmacy*  Lab Work:   Testing/Procedures:  NONE    NONE If you have labs (blood work) drawn today and your tests are completely normal, you will receive your results only by:  1-MyChart Message (if you have MyChart) OR  2-A paper copy in the mail.  If you have any lab test that is abnormal or we need to change your treatment, we will call you to review the results.  Special Instructions PLEASE DO RECUMBENT EXERCISES LIKE BIKING, ETC...  KEEP ENT(EAR, NOSE, THROAT MD) AND GASTRO APPOINTMENTS  MAINTAIN HYDRATION, MAY HAVE SPORTS DRINKS, ETC  INCREASE FIBER DIET-ATTACHED  Follow-Up: Your next appointment:  2-3 month(s) In Person with Bryan Lemma, MD  or Edd Fabian, FNP      At [facility], you and your health needs are our priority.  As part of our continuing mission to provide you with exceptional heart care, we have created designated Provider Care Teams.  These Care Teams include your primary Cardiologist (physician) and Advanced Practice Providers (APPs -  Physician Assistants and Nurse Practitioners) who all work together to provide you with the care you need, when you need it.  Important Information About Sugar     High-Fiber Eating Plan Fiber, also called dietary fiber, is a type of carbohydrate. It is found foods such as fruits, vegetables, whole grains, and beans. A high-fiber diet can have many health benefits. Your health care provider may recommend a high-fiber diet to help: Prevent constipation. Fiber can make your bowel movements more regular. Lower your cholesterol. Relieve the following conditions: Inflammation of veins in the anus (hemorrhoids). Inflammation of specific areas of the digestive tract (uncomplicated diverticulosis). A problem of the large intestine, also called the colon, that sometimes causes  pain and diarrhea (irritable bowel syndrome, or IBS). Prevent overeating as part of a weight-loss plan. Prevent heart disease, type 2 diabetes, and certain cancers. What are tips for following this plan? Reading food labels  Check the nutrition facts label on food products for the amount of dietary fiber. Choose foods that have 5 grams of fiber or more per serving. The goals for recommended daily fiber intake include: Men (age 41 or younger): 34-38 g. Men (over age 63): 28-34 g. Women (age 1 or younger): 25-28 g. Women (over age 90): 22-25 g. Your daily fiber goal is _____________ g. Shopping Choose whole fruits and vegetables instead of processed forms, such as apple juice or applesauce. Choose a wide variety of high-fiber foods such as avocados, lentils, oats, and kidney beans. Read the nutrition facts label of the foods you choose. Be aware of foods with added fiber. These foods often have high sugar and sodium amounts per serving. Cooking Use whole-grain flour for baking and cooking. Cook with brown rice instead of white rice. Meal planning Start the day with a breakfast that is high in fiber, such as a cereal that contains 5 g of fiber or more per serving. Eat breads and cereals that are made with whole-grain flour instead of refined flour or white flour. Eat brown rice, bulgur wheat, or millet instead of white rice. Use beans in place of meat in soups, salads, and pasta dishes. Be sure that half of the grains you eat each day are whole grains. General information You can get the recommended daily intake of dietary fiber by: Eating a variety  of fruits, vegetables, grains, nuts, and beans. Taking a fiber supplement if you are not able to take in enough fiber in your diet. It is better to get fiber through food than from a supplement. Gradually increase how much fiber you consume. If you increase your intake of dietary fiber too quickly, you may have bloating, cramping, or  gas. Drink plenty of water to help you digest fiber. Choose high-fiber snacks, such as berries, raw vegetables, nuts, and popcorn. What foods should I eat? Fruits Berries. Pears. Apples. Oranges. Avocado. Prunes and raisins. Dried figs. Vegetables Sweet potatoes. Spinach. Kale. Artichokes. Cabbage. Broccoli. Cauliflower. Green peas. Carrots. Squash. Grains Whole-grain breads. Multigrain cereal. Oats and oatmeal. Brown rice. Barley. Bulgur wheat. Millet. Quinoa. Bran muffins. Popcorn. Rye wafer crackers. Meats and other proteins Navy beans, kidney beans, and pinto beans. Soybeans. Split peas. Lentils. Nuts and seeds. Dairy Fiber-fortified yogurt. Beverages Fiber-fortified soy milk. Fiber-fortified orange juice. Other foods Fiber bars. The items listed above may not be a complete list of recommended foods and beverages. Contact a dietitian for more information. What foods should I avoid? Fruits Fruit juice. Cooked, strained fruit. Vegetables Fried potatoes. Canned vegetables. Well-cooked vegetables. Grains White bread. Pasta made with refined flour. White rice. Meats and other proteins Fatty cuts of meat. Fried chicken or fried fish. Dairy Milk. Yogurt. Cream cheese. Sour cream. Fats and oils Butters. Beverages Soft drinks. Other foods Cakes and pastries. The items listed above may not be a complete list of foods and beverages to avoid. Talk with your dietitian about what choices are best for you. Summary Fiber is a type of carbohydrate. It is found in foods such as fruits, vegetables, whole grains, and beans. A high-fiber diet has many benefits. It can help to prevent constipation, lower blood cholesterol, aid weight loss, and reduce your risk of heart disease, diabetes, and certain cancers. Increase your intake of fiber gradually. Increasing fiber too quickly may cause cramping, bloating, and gas. Drink plenty of water while you increase the amount of fiber you consume. The  best sources of fiber include whole fruits and vegetables, whole grains, nuts, seeds, and beans. This information is not intended to replace advice given to you by your health care provider. Make sure you discuss any questions you have with your health care provider. Document Revised: 01/08/2020 Document Reviewed: 01/08/2020 Elsevier Patient Education  2023 [employer].

## 2022-05-15 NOTE — Progress Notes (Unsigned)
[facility] Telephone:([phone]) [phone]   [phone]  INITIAL CONSULT NOTE  Patient Care Team: Sigmund Hazel, MD as PCP - General (Family Medicine) Marykay Lex, MD as PCP - Cardiology (Cardiology) Donnetta Hail, MD as Consulting Physician (Rheumatology)   CHIEF COMPLAINTS/PURPOSE OF CONSULTATION:  ITP  HISTORY OF PRESENTING ILLNESS:  Sarah Garza 51 y.o. female with medical history significant for diabetes, rheumatoid arthritis, Sjogren's syndrome currently on Plaquenil.  She presents to the hematology clinic to re-establish care for ITP. She was formerly seen by Dr. Truett Perna.   On review of the previous records, Sarah Garza has long standing thrombocytopenia as far back as 2008. Most recent labs from 04/17/2022 showed Wbc 3.7, Hgb 12.4, Plt 72.  On exam today, Sarah Garza reports that she does feel tired but she is able to complete her ADLs on her own. She reports 16 lb unintentional weight loss over the last 4-5 weeks. She does have occasional episodes of nausea and vomiting. She denies any abdominal pain. She has constipation and uses stool softeners/laxatives. She has daily bowel movements. She some blood in the stool with constipation. She reports having intermittent fevers with sweats. She denies shortness of breath, chest pain, cough. She has no other complaints. Rest of the 10 point ROS is below.   MEDICAL HISTORY:  Past Medical History:  Diagnosis Date   Arterial hypotension 10/17/2021   ITP (idiopathic thrombocytopenic purpura)    Low back pain 02/15/2016   Rheumatoid aortitis    Sjoegren syndrome    Syncope    Likely related to hypotension    SURGICAL HISTORY: Past Surgical History:  Procedure Laterality Date   FOOT SURGERY     HERNIA REPAIR     umbilical   TRANSTHORACIC ECHOCARDIOGRAM  10/17/2021   EF 55 to 60%.  Normal LV function with no RWMA.  Normal diastolic parameters.  Normal RV with RVP.  Normal aortic and mitral valves.   Normal RAP.    SOCIAL HISTORY: Social History   Socioeconomic History   Marital status: Single    Spouse name: Not on file   Number of children: 0   Years of education: 16   Highest education level: Not on file  Occupational History   Not on file  Tobacco Use   Smoking status: Never   Smokeless tobacco: Never  Vaping Use   Vaping Use: Never used  Substance and Sexual Activity   Alcohol use: No   Drug use: No   Sexual activity: Not on file  Other Topics Concern   Not on file  Social History Narrative   Lives at home w/ her mother   Right-handed   Drinks 1 cup of coffee daily   Social Determinants of Health   Financial Resource Strain: Not on file  Food Insecurity: Not on file  Transportation Needs: Not on file  Physical Activity: Not on file  Stress: Not on file  Social Connections: Not on file  Intimate Partner Violence: Not on file    FAMILY HISTORY: Family History  Problem Relation Age of Onset   Hypothyroidism Mother    COPD Father    Cirrhosis Father    Valvular heart disease Father        S/p MVR   Sjogren's syndrome Sister    Rheum arthritis Sister    Lung cancer Maternal Grandmother    Heart attack Maternal Grandfather    CAD Maternal Grandfather     ALLERGIES:  is allergic to cefdinir,  doxycycline hyclate, gluten meal, levaquin [levofloxacin], mucinex [guaifenesin er], penicillins, and robaxin [methocarbamol].  MEDICATIONS:  Current Outpatient Medications  Medication Sig Dispense Refill   acetaminophen (TYLENOL) 500 MG tablet Take 500 mg by mouth every 6 (six) hours as needed for moderate pain or headache.     Biotin 5000 MCG TABS Take 5,000 mcg by mouth daily.     Docusate Sodium (COLACE PO) Take by mouth as needed.     hydroxychloroquine (PLAQUENIL) 200 MG tablet Take 200 mg by mouth daily.     midodrine (PROAMATINE) 2.5 MG tablet Take 1 tablet (2.5 mg total) by mouth in the morning and at bedtime. May take an additional 2.5 mg in the evening  if needed daily 60 tablet 6   naproxen sodium (ALEVE) 220 MG tablet Take 220-440 mg by mouth daily as needed (cramps).     Propylene Glycol (SYSTANE BALANCE OP) Apply to eye.     Vitamin D, Ergocalciferol, (DRISDOL) 1.25 MG (50000 UNIT) CAPS capsule Take 50,000 Units by mouth every Friday.     No current facility-administered medications for this visit.    REVIEW OF SYSTEMS:   Constitutional: ( - ) fevers, ( - )  chills , ( - ) night sweats Eyes: ( - ) blurriness of vision, ( - ) double vision, ( - ) watery eyes Ears, nose, mouth, throat, and face: ( - ) mucositis, ( - ) sore throat Respiratory: ( - ) cough, ( - ) dyspnea, ( - ) wheezes Cardiovascular: ( - ) palpitation, ( - ) chest discomfort, ( - ) lower extremity swelling Gastrointestinal:  ( + ) nausea, ( - ) heartburn, ( + ) change in bowel habits Skin: ( - ) abnormal skin rashes Lymphatics: ( - ) new lymphadenopathy, ( - ) easy bruising Neurological: ( - ) numbness, ( - ) tingling, ( - ) new weaknesses Behavioral/Psych: ( - ) mood change, ( - ) new changes  All other systems were reviewed with the patient and are negative.  PHYSICAL EXAMINATION: ECOG PERFORMANCE STATUS: 1 - Symptomatic but completely ambulatory  Vitals:   05/16/22 0922  BP: (!) 102/52  Pulse: 75  Resp: 14  Temp: 97.9 F (36.6 C)  SpO2: 97%   Filed Weights   05/16/22 0922  Weight: 115 lb 6.4 oz (52.3 kg)    GENERAL: well appearing female in NAD  SKIN: skin color, texture, turgor are normal, no rashes or significant lesions EYES: conjunctiva are pink and non-injected, sclera clear OROPHARYNX: no exudate, no erythema; lips, buccal mucosa, and tongue normal  NECK: supple, non-tender LYMPH:  no palpable lymphadenopathy in the cervical or supraclavicular lymph nodes.  LUNGS: clear to auscultation and percussion with normal breathing effort HEART: regular rate & rhythm and no murmurs and no lower extremity edema ABDOMEN: soft, non-tender, non-distended,  normal bowel sounds Musculoskeletal: no cyanosis of digits and no clubbing  PSYCH: alert & oriented x 3, fluent speech NEURO: no focal motor/sensory deficits  LABORATORY DATA:  I have reviewed the data as listed    Latest Ref Rng & Units 05/16/2022   10:53 AM 10/02/2021    8:58 PM 03/21/2021   10:22 AM  CBC  WBC 4.0 - 10.5 K/uL 4.7  6.8  5.2   Hemoglobin 12.0 - 15.0 g/dL 33.3  54.5  62.5   Hematocrit 36.0 - 46.0 % 34.7  34.7  37.1   Platelets 150 - 400 K/uL 77  102  90  Latest Ref Rng & Units 05/16/2022   10:53 AM 10/02/2021    8:58 PM 03/21/2021   10:22 AM  CMP  Glucose 70 - 99 mg/dL 75  161  89   BUN 6 - 20 mg/dL Creatinine 0.44 - 1.00 mg/dL 0.96  0.45  4.09   Sodium 135 - 145 mmol/L 138  140  139   Potassium 3.5 - 5.1 mmol/L 3.9  3.9  3.6   Chloride 98 - 111 mmol/L 109  108  106   CO2 22 - 32 mmol/L Calcium 8.9 - 10.3 mg/dL 8.7  8.9  9.1   Total Protein 6.5 - 8.1 g/dL 7.0   7.4   Total Bilirubin 0.3 - 1.2 mg/dL 0.8   0.8   Alkaline Phos 38 - 126 U/L 42   45   AST 15 - 41 U/L 24   23   ALT 0 - 44 U/L 24   21    ASSESSMENT & PLAN Sarah Garza is a 51 y.o. female who presents to re-establish care for ITP. She was formerly under the care of Dr. Truett Perna.   We reviewed prior labs that show chronic thrombocytopenia without any required treatment. We reviewed patient's most recent labs that show  platelet count of 72K which has decreased from January 2023.   We will proceed with serologic workup to rule out other causes of worsening thrombocytopenia. Patient plans to undergo endoscopic evaluation for patient's ongoing GI symptoms and weight loss. If platelet count is greater than 50K, okay to proceed with endoscopic procedures.   #ITP, chronic: --Patient has not undergone treatment for platelets including steroid therapy, ritxumab or TPOs.  --Labs from today to check CBC w/diff, CMP, B12 level, folate level, Hep B and C serologies, HIV serology  and immature platelet fraction. --RTC in 6 months with repeat labs unless above workup requires further intervention.   Orders Placed This Encounter  Procedures   CBC with Differential (Cancer Center Only)    Standing Status:   Future    Number of Occurrences:   1    Standing Expiration Date:   05/16/2023   CMP (Cancer Center only)    Standing Status:   Future    Number of Occurrences:   1    Standing Expiration Date:   05/16/2023   Immature Platelet Fraction    Standing Status:   Future    Number of Occurrences:   1    Standing Expiration Date:   05/16/2023   Save Smear for Provider Slide Review    Standing Status:   Future    Number of Occurrences:   1    Standing Expiration Date:   05/16/2023   Vitamin B12    Standing Status:   Future    Number of Occurrences:   1    Standing Expiration Date:   05/15/2023   Methylmalonic acid, serum    Standing Status:   Future    Number of Occurrences:   1    Standing Expiration Date:   05/15/2023   Folate, Serum    Standing Status:   Future    Number of Occurrences:   1    Standing Expiration Date:   05/15/2023   Hepatitis B core antibody, total    Standing Status:   Future    Number of Occurrences:   1    Standing Expiration Date:   05/15/2023  Hepatitis B surface antibody    Standing Status:   Future    Number of Occurrences:   1    Standing Expiration Date:   05/15/2023   Hepatitis B surface antigen    Standing Status:   Future    Number of Occurrences:   1    Standing Expiration Date:   05/15/2023   Hepatitis C antibody    Standing Status:   Future    Number of Occurrences:   1    Standing Expiration Date:   05/15/2023   HIV antibody (with reflex)    Standing Status:   Future    Number of Occurrences:   1    Standing Expiration Date:   05/15/2023    All questions were answered. The patient knows to call the clinic with any problems, questions or concerns.  I have spent a total of 60 minutes minutes of face-to-face and  non-face-to-face time, preparing to see the patient, obtaining and/or reviewing separately obtained history, performing a medically appropriate examination, counseling and educating the patient, ordering tests/procedures,documenting clinical information in the electronic health record,and care coordination.   Georga Kaufmann, PA-C Department of Hematology/Oncology [facility] Phone: [phone]  Patient was seen with Dr. Leonides Schanz  I have read the above note and personally examined the patient. I agree with the assessment and plan as noted above.  Briefly Sarah Garza is a 51 year old female who presents for evaluation of chronic ITP.  The patient was previously seen by Dr. Truett Perna in the early 2000's.  She has never undergone treatment for chronic ITP as her platelet levels have consistently been elevated above 30.  Patient presents now with platelets in the 70s which is a drop from her normal baseline of approximately 100.  Today we will conduct a full thrombocytopenia work-up to include viral etiologies, nutritional deficiencies, and liver disease.  In the event no clear etiology can be found would confirm the diagnosis of chronic ITP.  At this time there is no need for intervention as her platelet count is consistently above 30.  Would recommend visitation with the patient in 6 months time.  In the event counts remained stable could consider yearly visits for monitoring.  Patient voiced understanding of the plan moving forward.   Ulysees Barns, MD Department of Hematology/Oncology [facility] at [facility] Phone: [phone] Pager: [phone] Email: [email or web address]

## 2022-05-16 ENCOUNTER — Encounter: Payer: Self-pay | Admitting: Physician Assistant

## 2022-05-16 ENCOUNTER — Inpatient Hospital Stay: Payer: BC Managed Care – PPO | Attending: Physician Assistant | Admitting: Physician Assistant

## 2022-05-16 ENCOUNTER — Other Ambulatory Visit: Payer: Self-pay

## 2022-05-16 ENCOUNTER — Inpatient Hospital Stay: Payer: BC Managed Care – PPO

## 2022-05-16 VITALS — BP 102/52 | HR 75 | Temp 97.9°F | Resp 14 | Wt 115.4 lb

## 2022-05-16 DIAGNOSIS — D696 Thrombocytopenia, unspecified: Secondary | ICD-10-CM

## 2022-05-16 DIAGNOSIS — M069 Rheumatoid arthritis, unspecified: Secondary | ICD-10-CM

## 2022-05-16 DIAGNOSIS — E119 Type 2 diabetes mellitus without complications: Secondary | ICD-10-CM | POA: Diagnosis not present

## 2022-05-16 DIAGNOSIS — D693 Immune thrombocytopenic purpura: Secondary | ICD-10-CM

## 2022-05-16 DIAGNOSIS — Z79899 Other long term (current) drug therapy: Secondary | ICD-10-CM | POA: Diagnosis not present

## 2022-05-16 DIAGNOSIS — M3500 Sicca syndrome, unspecified: Secondary | ICD-10-CM | POA: Diagnosis not present

## 2022-05-16 LAB — CMP (CANCER CENTER ONLY)
ALT: 24 U/L (ref 0–44)
AST: 24 U/L (ref 15–41)
Albumin: 3.9 g/dL (ref 3.5–5.0)
Alkaline Phosphatase: 42 U/L (ref 38–126)
Anion gap: 2 — ABNORMAL LOW (ref 5–15)
BUN: 14 mg/dL (ref 6–20)
CO2: 27 mmol/L (ref 22–32)
Calcium: 8.7 mg/dL — ABNORMAL LOW (ref 8.9–10.3)
Chloride: 109 mmol/L (ref 98–111)
Creatinine: 0.76 mg/dL (ref 0.44–1.00)
GFR, Estimated: >60 mL/min (ref >60)
Glucose, Bld: 75 mg/dL (ref 70–99)
Potassium: 3.9 mmol/L (ref 3.5–5.1)
Sodium: 138 mmol/L (ref 135–145)
Total Bilirubin: 0.8 mg/dL (ref 0.3–1.2)
Total Protein: 7 g/dL (ref 6.5–8.1)

## 2022-05-16 LAB — HEPATITIS B SURFACE ANTIGEN: Hepatitis B Surface Ag: NONREACTIVE

## 2022-05-16 LAB — VITAMIN B12: Vitamin B-12: 433 pg/mL (ref 180–914)

## 2022-05-16 LAB — CBC WITH DIFFERENTIAL (CANCER CENTER ONLY)
Abs Immature Granulocytes: 0.01 10*3/uL (ref 0.00–0.07)
Basophils Absolute: 0 10*3/uL (ref 0.0–0.1)
Basophils Relative: 1 %
Eosinophils Absolute: 0 10*3/uL (ref 0.0–0.5)
Eosinophils Relative: 1 %
HCT: 34.7 % — ABNORMAL LOW (ref 36.0–46.0)
Hemoglobin: 11.5 g/dL — ABNORMAL LOW (ref 12.0–15.0)
Immature Granulocytes: 0 %
Lymphocytes Relative: 21 %
Lymphs Abs: 1 10*3/uL (ref 0.7–4.0)
MCH: 30.3 pg (ref 26.0–34.0)
MCHC: 33.1 g/dL (ref 30.0–36.0)
MCV: 91.6 fL (ref 80.0–100.0)
Monocytes Absolute: 0.5 10*3/uL (ref 0.1–1.0)
Monocytes Relative: 10 %
Neutro Abs: 3.2 10*3/uL (ref 1.7–7.7)
Neutrophils Relative %: 67 %
Platelet Count: 77 10*3/uL — ABNORMAL LOW (ref 150–400)
RBC: 3.79 MIL/uL — ABNORMAL LOW (ref 3.87–5.11)
RDW: 13.6 % (ref 11.5–15.5)
WBC Count: 4.7 10*3/uL (ref 4.0–10.5)
nRBC: 0 % (ref 0.0–0.2)

## 2022-05-16 LAB — SAVE SMEAR(SSMR), FOR PROVIDER SLIDE REVIEW

## 2022-05-16 LAB — HEPATITIS B SURFACE ANTIBODY,QUALITATIVE: Hep B S Ab: NONREACTIVE

## 2022-05-16 LAB — IMMATURE PLATELET FRACTION: Immature Platelet Fraction: 17.1 % — ABNORMAL HIGH (ref 1.2–8.6)

## 2022-05-16 LAB — FOLATE: Folate: 6.5 ng/mL (ref >5.9)

## 2022-05-16 LAB — HIV ANTIBODY (ROUTINE TESTING W REFLEX): HIV Screen 4th Generation wRfx: NONREACTIVE

## 2022-05-16 LAB — HEPATITIS B CORE ANTIBODY, TOTAL: Hep B Core Total Ab: NONREACTIVE

## 2022-05-16 LAB — HEPATITIS C ANTIBODY: HCV Ab: NONREACTIVE

## 2022-05-18 DIAGNOSIS — H9311 Tinnitus, right ear: Secondary | ICD-10-CM | POA: Diagnosis not present

## 2022-05-18 DIAGNOSIS — H6121 Impacted cerumen, right ear: Secondary | ICD-10-CM | POA: Diagnosis not present

## 2022-05-18 DIAGNOSIS — H903 Sensorineural hearing loss, bilateral: Secondary | ICD-10-CM | POA: Diagnosis not present

## 2022-05-18 LAB — METHYLMALONIC ACID, SERUM: Methylmalonic Acid, Quantitative: 151 nmol/L (ref 0–378)

## 2022-05-22 ENCOUNTER — Encounter: Payer: Self-pay | Admitting: Physician Assistant

## 2022-05-23 ENCOUNTER — Telehealth: Payer: Self-pay | Admitting: Physician Assistant

## 2022-05-23 NOTE — Telephone Encounter (Signed)
Per 9/5 secure chat called and spoke to pt about appointment

## 2022-06-23 DIAGNOSIS — R921 Mammographic calcification found on diagnostic imaging of breast: Secondary | ICD-10-CM | POA: Diagnosis not present

## 2022-06-26 DIAGNOSIS — D2261 Melanocytic nevi of right upper limb, including shoulder: Secondary | ICD-10-CM | POA: Diagnosis not present

## 2022-06-26 DIAGNOSIS — Z85828 Personal history of other malignant neoplasm of skin: Secondary | ICD-10-CM | POA: Diagnosis not present

## 2022-06-26 DIAGNOSIS — C44712 Basal cell carcinoma of skin of right lower limb, including hip: Secondary | ICD-10-CM | POA: Diagnosis not present

## 2022-06-26 DIAGNOSIS — L821 Other seborrheic keratosis: Secondary | ICD-10-CM | POA: Diagnosis not present

## 2022-06-26 DIAGNOSIS — D225 Melanocytic nevi of trunk: Secondary | ICD-10-CM | POA: Diagnosis not present

## 2022-06-28 DIAGNOSIS — K2930 Chronic superficial gastritis without bleeding: Secondary | ICD-10-CM | POA: Diagnosis not present

## 2022-06-28 DIAGNOSIS — K5730 Diverticulosis of large intestine without perforation or abscess without bleeding: Secondary | ICD-10-CM | POA: Diagnosis not present

## 2022-06-28 DIAGNOSIS — K648 Other hemorrhoids: Secondary | ICD-10-CM | POA: Diagnosis not present

## 2022-06-28 DIAGNOSIS — K219 Gastro-esophageal reflux disease without esophagitis: Secondary | ICD-10-CM | POA: Diagnosis not present

## 2022-06-28 DIAGNOSIS — R1013 Epigastric pain: Secondary | ICD-10-CM | POA: Diagnosis not present

## 2022-06-28 DIAGNOSIS — K635 Polyp of colon: Secondary | ICD-10-CM | POA: Diagnosis not present

## 2022-06-28 DIAGNOSIS — K3189 Other diseases of stomach and duodenum: Secondary | ICD-10-CM | POA: Diagnosis not present

## 2022-06-28 DIAGNOSIS — B3781 Candidal esophagitis: Secondary | ICD-10-CM | POA: Diagnosis not present

## 2022-06-28 DIAGNOSIS — K2289 Other specified disease of esophagus: Secondary | ICD-10-CM | POA: Diagnosis not present

## 2022-06-28 DIAGNOSIS — Z1211 Encounter for screening for malignant neoplasm of colon: Secondary | ICD-10-CM | POA: Diagnosis not present

## 2022-07-10 ENCOUNTER — Encounter: Payer: Self-pay | Admitting: Cardiology

## 2022-07-10 ENCOUNTER — Ambulatory Visit: Payer: BC Managed Care – PPO | Attending: Cardiology | Admitting: Cardiology

## 2022-07-10 ENCOUNTER — Ambulatory Visit: Payer: BC Managed Care – PPO | Attending: Cardiology

## 2022-07-10 VITALS — BP 100/60 | HR 72 | Ht 64.0 in | Wt 117.4 lb

## 2022-07-10 DIAGNOSIS — R0609 Other forms of dyspnea: Secondary | ICD-10-CM

## 2022-07-10 DIAGNOSIS — R002 Palpitations: Secondary | ICD-10-CM | POA: Diagnosis not present

## 2022-07-10 DIAGNOSIS — R079 Chest pain, unspecified: Secondary | ICD-10-CM

## 2022-07-10 DIAGNOSIS — I950 Idiopathic hypotension: Secondary | ICD-10-CM

## 2022-07-10 NOTE — Progress Notes (Unsigned)
Enrolled for [employer] to mail a ZIO XT long term holter monitor to the patients address on file.  

## 2022-07-10 NOTE — Progress Notes (Signed)
Primary Care Provider: Sigmund Hazel, MD [facility] Cardiologist: Bryan Lemma, MD Electrophysiologist: None  Clinic Note: Chief Complaint  Patient presents with   Follow-up    Dizziness better, now having more frequent palpitations and chest tightness   ===================================  ASSESSMENT/PLAN   Problem List Items Addressed This Visit       Cardiology Problems   Arterial hypotension - Primary (Chronic)    BP remains low, but better today.  I again stressed importance of hydration with electrolyte supplementation, liberalization of salt.  Supine/recumbent exercises, avoiding rapid position changes. Avoid caffeine and sweets.   Now on midodrine twice daily and feeling overall better. Okay to take additional dose of midodrine if she feels worse.        Other   Chest pain of uncertain etiology    Sharp chest discomfort that seems to be associated with palpitations.  I think it is one of the same, but cannot be sure.  For now we can evaluate with a monitor to see if there is any arrhythmias.  We have talked about doing a stress test in the past.  If she continues to have symptoms, I think we would like to proceed with symptoms of ischemic evaluation but not now.      DOE (dyspnea on exertion)   Palpitations    Short-lived Sx of "pinnching" sensation in chests up to 15 min - not described as fluttering - although occasional does feel like it. = need to exclude SVT or other arrhythmias  14-day Zio patch monitor      Relevant Orders   LONG TERM MONITOR (3-14 DAYS)   ===================================  HPI:    Sarah Garza is a 51 y.o. female with a PMH notable for multiple rheumatologic disorders including Sjogren's, RA, ITP along with chronic hypotension and hypoglycemia with dizziness who presents today for 35-month follow-up.  I last saw her in April 2023 for low blood pressures and dizziness.  Was started on midodrine.  Told her to  liberalize her salt intake and continue to wear support stockings.  [name] was last seen on 05/05/2022 by Edd Fabian, NP-noted weakness in legs as well as dizziness.  Noted having heaviness & flushed sensation in her legs after removing sore stockings.  Also some forgetfulness.  BP still low (90s /60s prior to taking midodrine).  Drinking about 100 ounces of fluid daily as well as liquid IV hydration drinks.  Also noted to have a lot of cerumen buildup in her ears that may be contributing. => Midodrine increased to twice daily, maintain hydration and electrolyte supplementation, increase supine recumbent type activities.  Continue support stockings.  Recent Hospitalizations: None  Reviewed  CV studies:    The following studies were reviewed today: (if available, images/films reviewed: From Epic Chart or Care Everywhere) No recent studies:  Interval History:   Sarah Garza returns here today stating that her dizziness is actually doing a little better.  The middle ear issues in the cerumen compaction be cleared up seem to help a lot.  She has not had to use any additional doses of midodrine. She has been dealing issue with constipation despite her aggressive hydration.  While the dizziness is improved but she is starting to notice episodes of sharp pinching sensations in her chest and an irregular heartbeat usually when she is lying down.  These episodes can last anywhere anywhere from 15 to 20 minutes.  She feels a tingling sensation and with palpitations.  She says that actually the episodes do not happen with exertion.  She still has dizziness but her pressures seem to have stabilized out now.  Her legs still ache I have cramps, but she continues to hydrate and use electrolyte supplementation.  She does not like wearing support stockings but she does not want it does not help.  But she also elevates her feet when possible.  CV Review of Symptoms (Summary):  positive for -  chest pain, irregular heartbeat, palpitations, rapid heart rate, and these are associated with lightheadedness and shortness of breath.  Is a sharp chest pain. negative for - edema, orthopnea, paroxysmal nocturnal dyspnea, shortness of breath, or syncope or near syncope, TIA/amaurosis fugax, claudication  REVIEWED OF SYSTEMS   Review of Systems  Constitutional:  Positive for malaise/fatigue. Negative for weight loss.  HENT:  Negative for congestion and nosebleeds.   Respiratory:  Negative for cough and shortness of breath.   Gastrointestinal:  Positive for abdominal pain, constipation and heartburn. Negative for blood in stool and melena.  Genitourinary:  Negative for hematuria.  Musculoskeletal:  Positive for myalgias.  Neurological:  Positive for dizziness (Much better after having ears cleared out). Negative for focal weakness and loss of consciousness.  Psychiatric/Behavioral:  Negative for memory loss. The patient is nervous/anxious and has insomnia.    I have reviewed and (if needed) personally updated the patient's problem list, medications, allergies, past medical and surgical history, social and family history.   PAST MEDICAL HISTORY   Past Medical History:  Diagnosis Date   Arterial hypotension 10/17/2021   ITP (idiopathic thrombocytopenic purpura)    Low back pain 02/15/2016   Rheumatoid aortitis    Sjoegren syndrome    Syncope    Likely related to hypotension    PAST SURGICAL HISTORY   Past Surgical History:  Procedure Laterality Date   FOOT SURGERY     HERNIA REPAIR     umbilical   TRANSTHORACIC ECHOCARDIOGRAM  10/17/2021   EF 55 to 60%.  Normal LV function with no RWMA.  Normal diastolic parameters.  Normal RV with RVP.  Normal aortic and mitral valves.  Normal RAP.    There is no immunization history on file for this patient.  MEDICATIONS/ALLERGIES   Current Meds  Medication Sig   Biotin 5000 MCG TABS Take 5,000 mcg by mouth daily.   hydroxychloroquine  (PLAQUENIL) 200 MG tablet Take 200 mg by mouth daily.   midodrine (PROAMATINE) 2.5 MG tablet Take 1 tablet (2.5 mg total) by mouth in the morning and at bedtime. May take an additional 2.5 mg in the evening if needed daily   naproxen sodium (ALEVE) 220 MG tablet Take 220-440 mg by mouth daily as needed (cramps).   Propylene Glycol (SYSTANE BALANCE OP) Apply to eye.   Vitamin D, Ergocalciferol, (DRISDOL) 1.25 MG (50000 UNIT) CAPS capsule Take 50,000 Units by mouth every Friday.    Allergies  Allergen Reactions   Cefdinir Shortness Of Breath and Diarrhea   Doxycycline Hyclate     Other reaction(s): severe thrush when on doxycycline for a long time.   Gluten Meal    Levaquin [Levofloxacin] Other (See Comments)    Joint pain   Mucinex [Guaifenesin Er] Other (See Comments)    "Felt like I was going to pass out"   Penicillins Hives   Robaxin [Methocarbamol]     Caused hotness, and nausea     SOCIAL HISTORY/FAMILY HISTORY   Reviewed in Epic:  Pertinent findings:  Social History  Tobacco Use   Smoking status: Never   Smokeless tobacco: Never  Vaping Use   Vaping Use: Never used  Substance Use Topics   Alcohol use: No   Drug use: No   Social History   Social History Narrative   Lives at home w/ her mother   Right-handed   Drinks 1 cup of coffee daily    OBJCTIVE -PE, EKG, labs   Wt Readings from Last 3 Encounters:  07/10/22 117 lb 6.4 oz (53.3 kg)  05/16/22 115 lb 6.4 oz (52.3 kg)  05/05/22 115 lb 12.8 oz (52.5 kg)    Physical Exam: BP 100/60   Pulse 72   Ht 5\' 4"  (1.626 m)   Wt 117 lb 6.4 oz (53.3 kg)   SpO2 99%   BMI 20.15 kg/m  Physical Exam Vitals reviewed.  Constitutional:      General: She is not in acute distress.    Appearance: Normal appearance. She is normal weight. She is not ill-appearing or toxic-appearing.  HENT:     Head: Normocephalic and atraumatic.  Neck:     Vascular: No carotid bruit or JVD.  Cardiovascular:     Rate and Rhythm:  Normal rate and regular rhythm. Occasional Extrasystoles are present.    Chest Wall: PMI is not displaced.     Pulses: Normal pulses.     Heart sounds: S1 normal and S2 normal. No murmur heard.    No friction rub. No gallop.  Pulmonary:     Effort: Pulmonary effort is normal. No respiratory distress.     Breath sounds: Normal breath sounds. No wheezing, rhonchi or rales.  Chest:     Chest wall: No tenderness.  Musculoskeletal:        General: Normal range of motion.     Cervical back: Normal range of motion and neck supple.  Skin:    General: Skin is warm and dry.  Neurological:     General: No focal deficit present.     Mental Status: She is alert and oriented to person, place, and time.     Gait: Gait normal.  Psychiatric:        Mood and Affect: Mood normal.        Behavior: Behavior normal.        Thought Content: Thought content normal.        Judgment: Judgment normal.     Comments: Little less anxious today.      Adult ECG Report N/A  Recent Labs: Reviewed No results found for: "CHOL", "HDL", "LDLCALC", "LDLDIRECT", "TRIG", "CHOLHDL" Lab Results  Component Value Date   CREATININE 0.76 05/16/2022   BUN 14 05/16/2022   NA 138 05/16/2022   K 3.9 05/16/2022   CL 109 05/16/2022   CO2 27 05/16/2022      Latest Ref Rng & Units 05/16/2022   10:53 AM 10/02/2021    8:58 PM 03/21/2021   10:22 AM  CBC  WBC 4.0 - 10.5 K/uL 4.7  6.8  5.2   Hemoglobin 12.0 - 15.0 g/dL 11.5  11.0  12.3   Hematocrit 36.0 - 46.0 % 34.7  34.7  37.1   Platelets 150 - 400 K/uL 77  102  90     No results found for: "HGBA1C" Lab Results  Component Value Date   TSH 2.343 03/09/2021    ================================================== I spent a total of 19 minutes with the patient spent in direct patient consultation.  Additional time spent with chart review  / charting (  studies, outside notes, etc): 18 min Total Time: 37 min  Current medicines are reviewed at length with the patient  today.  (+/- concerns) n/a  Notice: This dictation was prepared with Dragon dictation along with smart phrase technology. Any transcriptional errors that result from this process are unintentional and may not be corrected upon review.  Studies Ordered:   Orders Placed This Encounter  Procedures   LONG TERM MONITOR (3-14 DAYS)   No orders of the defined types were placed in this encounter.   Patient Instructions / Medication Changes & Studies & Tests Ordered   Patient Instructions  Medication Instructions:  No changes  *If you need a refill on your cardiac medications before your next appointment, please call your pharmacy*   Lab Work:  Not needed   Testing/Procedures:  Will be mailed to you in 3 to 5 days Your physician has recommended that you wear a holter monitor 14 day Zio .   Follow-Up: At [facility], you and your health needs are our priority.  As part of our continuing mission to provide you with exceptional heart care, we have created designated Provider Care Teams.  These Care Teams include your primary Cardiologist (physician) and Advanced Practice Providers (APPs -  Physician Assistants and Nurse Practitioners) who all work together to provide you with the care you need, when you need it.  Your next appointment:   2 to 3 month(s)  The format for your next appointment:   In Person  Provider:   Bryan Lemma, MD    Other Instructions   Conitnue monitor when you are having chest discomfort  ZIO XT- Long Term Monitor Instructions  Your physician has requested you wear a ZIO patch monitor for 14 days.      Marykay Lex, MD, MS Bryan Lemma, M.D., M.S. Interventional Cardiologist  [facility]  Pager # [phone] Phone # [phone] [address] [place], [place] [address]   Thank you for choosing [facility] [place]!!

## 2022-07-10 NOTE — Assessment & Plan Note (Addendum)
Short-lived Sx of "pinnching" sensation in chests up to 15 min - not described as fluttering - although occasional does feel like it. = need to exclude SVT or other arrhythmias  14-day Zio patch monitor

## 2022-07-10 NOTE — Patient Instructions (Signed)
Medication Instructions:  No changes  *If you need a refill on your cardiac medications before your next appointment, please call your pharmacy*   Lab Work:  Not needed   Testing/Procedures:  Will be mailed to you in 3 to 5 days Your physician has recommended that you wear a holter monitor 14 day Zio . Holter monitors are medical devices that record the heart's electrical activity. Doctors most often use these monitors to diagnose arrhythmias. Arrhythmias are problems with the speed or rhythm of the heartbeat. The monitor is a small, portable device. You can wear one while you do your normal daily activities. This is usually used to diagnose what is causing palpitations/syncope (passing out).   Follow-Up: At [facility], you and your health needs are our priority.  As part of our continuing mission to provide you with exceptional heart care, we have created designated Provider Care Teams.  These Care Teams include your primary Cardiologist (physician) and Advanced Practice Providers (APPs -  Physician Assistants and Nurse Practitioners) who all work together to provide you with the care you need, when you need it.     Your next appointment:   2 to 3 month(s)  The format for your next appointment:   In Person  Provider:   Glenetta Hew, MD    Other Instructions   Conitnue monitor when you are having chest discomfort  ZIO XT- Long Term Monitor Instructions  Your physician has requested you wear a ZIO patch monitor for 14 days.  This is a single patch monitor. [employer] supplies one patch monitor per enrollment. Additional stickers are not available. Please do not apply patch if you will be having a Nuclear Stress Test,  Echocardiogram, Cardiac CT, MRI, or Chest Xray during the period you would be wearing the  monitor. The patch cannot be worn during these tests. You cannot remove and re-apply the  ZIO XT patch monitor.  Your ZIO patch monitor will be mailed 3 day USPS to your  address on file. It may take 3-5 days  to receive your monitor after you have been enrolled.  Once you have received your monitor, please review the enclosed instructions. Your monitor  has already been registered assigning a specific monitor serial # to you.  Billing and Patient Assistance Program Information  We have supplied [employer] with any of your insurance information on file for billing purposes. [employer] offers a sliding scale Patient Assistance Program for patients that do not have  insurance, or whose insurance does not completely cover the cost of the ZIO monitor.  You must apply for the Patient Assistance Program to qualify for this discounted rate.  To apply, please call [employer] at [phone], select option 4, select option 2, ask to apply for  Patient Assistance Program. Theodore Demark will ask your household income, and how many people  are in your household. They will quote your out-of-pocket cost based on that information.  [employer] will also be able to set up a 64-month, interest-free payment plan if needed.  Applying the monitor   Shave hair from upper left chest.  Hold abrader disc by orange tab. Rub abrader in 40 strokes over the upper left chest as  indicated in your monitor instructions.  Clean area with 4 enclosed alcohol pads. Let dry.  Apply patch as indicated in monitor instructions. Patch will be placed under collarbone on left  side of chest with arrow pointing upward.  Rub patch adhesive wings for 2 minutes. Remove white label marked "1".  Remove the white  label marked "2". Rub patch adhesive wings for 2 additional minutes.  While looking in a mirror, press and release button in center of patch. A small green light will  flash 3-4 times. This will be your only indicator that the monitor has been turned on.  Do not shower for the first 24 hours. You may shower after the first 24 hours.  Press the button if you feel a symptom. You will hear a small click. Record  Date, Time and  Symptom in the Patient Logbook.  When you are ready to remove the patch, follow instructions on the last 2 pages of Patient  Logbook. Stick patch monitor onto the last page of Patient Logbook.  Place Patient Logbook in the blue and white box. Use locking tab on box and tape box closed  securely. The blue and white box has prepaid postage on it. Please place it in the mailbox as  soon as possible. Your physician should have your test results approximately 7 days after the  monitor has been mailed back to [facility].  Call [facility] at [phone] if you have questions regarding  your ZIO XT patch monitor. Call them immediately if you see an orange light blinking on your  monitor.  If your monitor falls off in less than 4 days, contact our Monitor department at [phone].  If your monitor becomes loose or falls off after 4 days call [employer] at [phone] for  suggestions on securing your monitor

## 2022-07-16 ENCOUNTER — Encounter: Payer: Self-pay | Admitting: Cardiology

## 2022-07-16 NOTE — Assessment & Plan Note (Signed)
BP remains low, but better today.  I again stressed importance of hydration with electrolyte supplementation, liberalization of salt.  Supine/recumbent exercises, avoiding rapid position changes. Avoid caffeine and sweets.   Now on midodrine twice daily and feeling overall better. Okay to take additional dose of midodrine if she feels worse.

## 2022-07-16 NOTE — Assessment & Plan Note (Signed)
Sharp chest discomfort that seems to be associated with palpitations.  I think it is one of the same, but cannot be sure.  For now we can evaluate with a monitor to see if there is any arrhythmias.  We have talked about doing a stress test in the past.  If she continues to have symptoms, I think we would like to proceed with symptoms of ischemic evaluation but not now.

## 2022-07-17 DIAGNOSIS — R002 Palpitations: Secondary | ICD-10-CM

## 2022-07-26 DIAGNOSIS — R5383 Other fatigue: Secondary | ICD-10-CM | POA: Diagnosis not present

## 2022-08-08 DIAGNOSIS — B3781 Candidal esophagitis: Secondary | ICD-10-CM | POA: Diagnosis not present

## 2022-08-08 DIAGNOSIS — K8020 Calculus of gallbladder without cholecystitis without obstruction: Secondary | ICD-10-CM | POA: Diagnosis not present

## 2022-08-08 DIAGNOSIS — K639 Disease of intestine, unspecified: Secondary | ICD-10-CM | POA: Diagnosis not present

## 2022-08-08 DIAGNOSIS — Z1211 Encounter for screening for malignant neoplasm of colon: Secondary | ICD-10-CM | POA: Diagnosis not present

## 2022-09-22 DIAGNOSIS — M3500 Sicca syndrome, unspecified: Secondary | ICD-10-CM | POA: Diagnosis not present

## 2022-09-22 DIAGNOSIS — M5440 Lumbago with sciatica, unspecified side: Secondary | ICD-10-CM | POA: Diagnosis not present

## 2022-09-22 DIAGNOSIS — R21 Rash and other nonspecific skin eruption: Secondary | ICD-10-CM | POA: Diagnosis not present

## 2022-09-22 DIAGNOSIS — D696 Thrombocytopenia, unspecified: Secondary | ICD-10-CM | POA: Diagnosis not present

## 2022-10-10 ENCOUNTER — Ambulatory Visit: Payer: BC Managed Care – PPO | Admitting: Cardiology

## 2022-10-10 NOTE — Progress Notes (Unsigned)
Cardiology Office Note:    Date:  10/11/2022   ID:  Sarah Garza, DOB [birth year], MRN [id number]  PCP:  Kathyrn Lass, [place] Providers Cardiologist:  Glenetta Hew, MD Cardiology APP:  Ledora Bottcher, [place] { Referring MD: Kathyrn Lass, MD   Chief Complaint  Patient presents with   Follow-up    Arterial hypotension    History of Present Illness:    Sarah Garza is a 52 y.o. female with a hx of arterial hypotension, DM, ITP, rheumatoid aortitis, Sjogren's syndrome, palpitations, DOE, and bilateral lower extremity edema.  She has had issues with syncope and hypertension in the past and is now on midodrine.  She uses salt liberally and wears lower extremity support hose.  She was last seen in clinic by Dr. Ellyn Hack 07/10/2022 and reported chest pain associated with palpitations.  Heart monitor was placed and did not show significant arrhythmia.  Symptoms were generally associated with sinus rhythm but there were some short runs of A. tach.  She continues to have chest pain, may consider ischemic evaluation.  She presents today for follow-up. She is taking 5 mg twice daily with meals. She is not taking at supper.   She reports leg heaviness, not walking straight at times, and feeling like she is "floating" instead of walking correctly. She has been evaluated by neurology. She denies leg pain, per se, but reports heaviness. She denies symptoms of claudication. She has strong pedal pulses and no signs of hypervolemia, although she wears compression stockings. She tried to wean off of plaquenil, but developed joint pain. She is in perimenopause with likely intermittent hot flashes.     Past Medical History:  Diagnosis Date   Arterial hypotension 10/17/2021   ITP (idiopathic thrombocytopenic purpura)    Low back pain 02/15/2016   Rheumatoid aortitis    Sjoegren syndrome    Syncope    Likely related to hypotension    Past Surgical History:  Procedure  Laterality Date   FOOT SURGERY     HERNIA REPAIR     umbilical   TRANSTHORACIC ECHOCARDIOGRAM  10/17/2021   EF 55 to 60%.  Normal LV function with no RWMA.  Normal diastolic parameters.  Normal RV with RVP.  Normal aortic and mitral valves.  Normal RAP.    Current Medications: Current Meds  Medication Sig   hydroxychloroquine (PLAQUENIL) 200 MG tablet Take 200 mg by mouth daily.   midodrine (PROAMATINE) 2.5 MG tablet Take 1 tablet (2.5 mg total) by mouth in the morning and at bedtime. May take an additional 2.5 mg in the evening if needed daily   naproxen sodium (ALEVE) 220 MG tablet Take 220-440 mg by mouth daily as needed (cramps).   Propylene Glycol (SYSTANE BALANCE OP) Apply to eye.   Vitamin D, Ergocalciferol, (DRISDOL) 1.25 MG (50000 UNIT) CAPS capsule Take 50,000 Units by mouth every Friday.     Allergies:   Cefdinir, Doxycycline hyclate, Gluten meal, Levaquin [levofloxacin], Mucinex [guaifenesin er], Penicillins, and Robaxin [methocarbamol]   Social History   Socioeconomic History   Marital status: Single    Spouse name: Not on file   Number of children: 0   Years of education: 16   Highest education level: Not on file  Occupational History   Not on file  Tobacco Use   Smoking status: Never   Smokeless tobacco: Never  Vaping Use   Vaping Use: Never used  Substance and Sexual Activity   Alcohol use: No  Drug use: No   Sexual activity: Not on file  Other Topics Concern   Not on file  Social History Narrative   Lives at home w/ her mother   Right-handed   Drinks 1 cup of coffee daily   Social Determinants of Health   Financial Resource Strain: Not on file  Food Insecurity: Not on file  Transportation Needs: Not on file  Physical Activity: Not on file  Stress: Not on file  Social Connections: Not on file     Family History: The patient's family history includes CAD in her maternal grandfather; COPD in her father; Cirrhosis in her father; Heart attack in  her maternal grandfather; Hypothyroidism in her mother; Lung cancer in her maternal grandmother; Rheum arthritis in her sister; Sjogren's syndrome in her sister; Valvular heart disease in her father.  ROS:   Please see the history of present illness.     All other systems reviewed and are negative.  EKGs/Labs/Other Studies Reviewed:    The following studies were reviewed today:  Heart monitor 08/08/22: Recommendations     Predominant underlying rhythm: Sinus Rhythm (1  AVB): HR range 47-136 bpm, avg 72 bpm   Rare (<1%) PACs and PVCs.  No PVC bigeminy/trigeminy.   3 Atrial Runs: Fastest 4 beats w/ HR Range 124-133 bpm, Avg 129 bpm, 1.9 sec; Longest 7 beats w/ HR Range 102-115 bpm, Avg 109 bpm, 3.9 sec   No Sustained Arrhythmias: Atrial Tachycardia (AT), Supraventricular Tachycardia (SVT), Atrial Fibrillation (A-Fib), Atrial Flutter (A-Flutter), Sustained Ventricular Tachycardia (VT)   Symptoms are noted mostly with sinus rhythm.  Occasional fluttering noted with sinus rhythm and PVC as well as the 3 short atrial runs..   Overall, pretty benign study with no gross abnormalities.  Symptoms mostly noted with benign findings on monitor.   Would simply monitor for progression of symptoms & would not recommend medical management.  EKG:  EKG is  ordered today.  The ekg ordered today demonstrates sinus rhythm with HR 67  Recent Labs: 05/16/2022: ALT 24; BUN 14; Creatinine 0.76; Hemoglobin 11.5; Platelet Count 77; Potassium 3.9; Sodium 138  Recent Lipid Panel No results found for: "CHOL", "TRIG", "HDL", "CHOLHDL", "VLDL", "LDLCALC", "LDLDIRECT"   Risk Assessment/Calculations:                Physical Exam:    VS:  BP 98/60   Pulse 68   Ht 5\' 4"  (1.626 m)   Wt 116 lb 3.2 oz (52.7 kg)   SpO2 100%   BMI 19.95 kg/m     Wt Readings from Last 3 Encounters:  10/11/22 116 lb 3.2 oz (52.7 kg)  07/10/22 117 lb 6.4 oz (53.3 kg)  05/16/22 115 lb 6.4 oz (52.3 kg)     GEN:  Well  nourished, well developed in no acute distress HEENT: Normal NECK: No JVD; No carotid bruits LYMPHATICS: No lymphadenopathy CARDIAC: RRR, no murmurs, rubs, gallops RESPIRATORY:  Clear to auscultation without rales, wheezing or rhonchi  ABDOMEN: Soft, non-tender, non-distended MUSCULOSKELETAL:  No edema; No deformity  SKIN: Warm and dry NEUROLOGIC:  Alert and oriented x 3 PSYCHIATRIC:  Normal affect   ASSESSMENT:    1. Idiopathic hypotension   2. Palpitations   3. Chest pain of uncertain etiology   4. Leg heaviness    PLAN:    In order of problems listed above:  Arterial hypotension She understands the importance of hydration with electrolyte supplementation and liberalization of salt Avoid rapid position changes Use supine/recumbent exercises Now  on midodrine twice daily - 5 mg No recent syncope   Chest pain, DOE Palpitations Heart monitor largely unrevealing She is not having more chest pain, will hold off on ischemic evaluation for now. Of note, may not be a candidate for CT coronary due to hypotension and need for nitro.   Leg heaviness I do not think symptoms are coming from a circulation/arterial problem She has been evaluated by neurology I suggested a Sjogren's specialist at [facility] to evaluate if plaquenil is her best option. I also advised seeing her PCP about possible HRT - given her myriad of complaints I do not want to be dismissive about the role of fluctuating hormones.      I suggested seeing a sjogren's specialist at [facility]. Consider HRT.      Medication Adjustments/Labs and Tests Ordered: Current medicines are reviewed at length with the patient today.  Concerns regarding medicines are outlined above.  No orders of the defined types were placed in this encounter.  No orders of the defined types were placed in this encounter.   Patient Instructions  Medication Instructions:  Your physician recommends that you continue on your current medications as  directed. Please refer to the Current Medication list given to you today.  *If you need a refill on your cardiac medications before your next appointment, please call your pharmacy*   Lab Work: NONE If you have labs (blood work) drawn today and your tests are completely normal, you will receive your results only by: [place] (if you have MyChart) OR A paper copy in the mail If you have any lab test that is abnormal or we need to change your treatment, we will call you to review the results.   Testing/Procedures: NONE   Follow-Up: At [facility], you and your health needs are our priority.  As part of our continuing mission to provide you with exceptional heart care, we have created designated Provider Care Teams.  These Care Teams include your primary Cardiologist (physician) and Advanced Practice Providers (APPs -  Physician Assistants and Nurse Practitioners) who all work together to provide you with the care you need, when you need it.  We recommend signing up for the patient portal called "MyChart".  Sign up information is provided on this After Visit Summary.  MyChart is used to connect with patients for Virtual Visits (Telemedicine).  Patients are able to view lab/test results, encounter notes, upcoming appointments, etc.  Non-urgent messages can be sent to your provider as well.   To learn more about what you can do with MyChart, go to [email or web address].    Your next appointment:   6 month(s)  Provider:   Glenetta Hew, MD      Signed, Tami Lin Fujiko Picazo, [place]  10/11/2022 4:30 PM    [place] [place]

## 2022-10-10 NOTE — Progress Notes (Deleted)
Primary Care Provider: Kathyrn Lass, [place] Cardiologist: Glenetta Hew, MD Electrophysiologist: None  Clinic Note: No chief complaint on file.   ===================================  ASSESSMENT/PLAN   Problem List Items Addressed This Visit   None   ===================================  HPI:    Sarah Garza is a 52 y.o. female with a PMH of multiple rheumatologic disorders including Sjogren's syndrome, rheumatoid arthritis, ITP, and chronic hypotension who presents today for follow-up of dizziness/weakness and Zio Patch review.   Sarah Garza was last seen on 07/10/2022 for hypotension and dizziness. She wore a 2 week Zio patch and was instructed to maintain salt and fluid intake to bolster BP. Doing well with midodrine BID.   Recent Hospitalizations: None  Reviewed  CV studies:    The following studies were reviewed today: (if available, images/films reviewed: From Epic Chart or Care Everywhere) Long-term monitor 07/10/2022: Predominant underlying rhythm: Sinus with avg HR 72 bpm, rare PACs and PVCs without bigeminy/trigeminy. 3 atrial runs, longest 7 beats. No sustained arrhythmias. Symptoms noted with benign findings on monitor and with all three atrial runs.  Echocardiogram 10/17/2021: LVEF 55-60%, normal wall motion. No diastolic dysfunction. Normal RV and valves.  Interval History:   Sarah Garza   CV Review of Symptoms (Summary): Cardiovascular ROS: {roscv:310661}  REVIEWED OF SYSTEMS   ROS  I have reviewed and (if needed) personally updated the patient's problem list, medications, allergies, past medical and surgical history, social and family history.   PAST MEDICAL HISTORY   Past Medical History:  Diagnosis Date   Arterial hypotension 10/17/2021   ITP (idiopathic thrombocytopenic purpura)    Low back pain 02/15/2016   Rheumatoid aortitis    Sjoegren syndrome    Syncope    Likely related to hypotension    PAST  SURGICAL HISTORY   Past Surgical History:  Procedure Laterality Date   FOOT SURGERY     HERNIA REPAIR     umbilical   TRANSTHORACIC ECHOCARDIOGRAM  10/17/2021   EF 55 to 60%.  Normal LV function with no RWMA.  Normal diastolic parameters.  Normal RV with RVP.  Normal aortic and mitral valves.  Normal RAP.     There is no immunization history on file for this patient.  MEDICATIONS/ALLERGIES   No outpatient medications have been marked as taking for the 10/10/22 encounter (Appointment) with Leonie Man, MD.    Allergies  Allergen Reactions   Cefdinir Shortness Of Breath and Diarrhea   Doxycycline Hyclate     Other reaction(s): severe thrush when on doxycycline for a long time.   Gluten Meal    Levaquin [Levofloxacin] Other (See Comments)    Joint pain   Mucinex [Guaifenesin Er] Other (See Comments)    "Felt like I was going to pass out"   Penicillins Hives   Robaxin [Methocarbamol]     Caused hotness, and nausea     SOCIAL HISTORY/FAMILY HISTORY   Reviewed in Epic:  Pertinent findings:  Social History   Tobacco Use   Smoking status: Never   Smokeless tobacco: Never  Vaping Use   Vaping Use: Never used  Substance Use Topics   Alcohol use: No   Drug use: No   Social History   Social History Narrative   Lives at home w/ her mother   Right-handed   Drinks 1 cup of coffee daily    OBJCTIVE -PE, EKG, labs   Wt Readings from Last 3 Encounters:  07/10/22 117 lb 6.4 oz (53.3  kg)  05/16/22 115 lb 6.4 oz (52.3 kg)  05/05/22 115 lb 12.8 oz (52.5 kg)    Physical Exam: There were no vitals taken for this visit. Physical Exam   Adult ECG Report  Rate: *** ;  Rhythm: {rhythm:17366};   Narrative Interpretation: ***  Recent Labs:  ***  No results found for: "CHOL", "HDL", "LDLCALC", "LDLDIRECT", "TRIG", "CHOLHDL" Lab Results  Component Value Date   CREATININE 0.76 05/16/2022   BUN 14 05/16/2022   NA 138 05/16/2022   K 3.9 05/16/2022   CL 109 05/16/2022    CO2 27 05/16/2022      Latest Ref Rng & Units 05/16/2022   10:53 AM 10/02/2021    8:58 PM 03/21/2021   10:22 AM  CBC  WBC 4.0 - 10.5 K/uL 4.7  6.8  5.2   Hemoglobin 12.0 - 15.0 g/dL 11.5  11.0  12.3   Hematocrit 36.0 - 46.0 % 34.7  34.7  37.1   Platelets 150 - 400 K/uL 77  102  90     No results found for: "HGBA1C" Lab Results  Component Value Date   TSH 2.343 03/09/2021    ================================================== I spent a total of ***minutes with the patient spent in direct patient consultation.  Additional time spent with chart review  / charting (studies, outside notes, etc): *** min Total Time: *** min  Current medicines are reviewed at length with the patient today.  (+/- concerns) ***  Notice: This dictation was prepared with Dragon dictation along with smart phrase technology. Any transcriptional errors that result from this process are unintentional and may not be corrected upon review.  Studies Ordered:   No orders of the defined types were placed in this encounter.  No orders of the defined types were placed in this encounter.   Patient Instructions / Medication Changes & Studies & Tests Ordered   There are no Patient Instructions on file for this visit.     @DHSIGNL$ @   Thank you for choosing [place] at [employer]!!   @DHHCLOGOHEART$ @

## 2022-10-11 ENCOUNTER — Ambulatory Visit: Payer: BC Managed Care – PPO | Attending: Cardiology | Admitting: Physician Assistant

## 2022-10-11 ENCOUNTER — Encounter: Payer: Self-pay | Admitting: Physician Assistant

## 2022-10-11 VITALS — BP 98/60 | HR 68 | Ht 64.0 in | Wt 116.2 lb

## 2022-10-11 DIAGNOSIS — R079 Chest pain, unspecified: Secondary | ICD-10-CM

## 2022-10-11 DIAGNOSIS — R002 Palpitations: Secondary | ICD-10-CM

## 2022-10-11 DIAGNOSIS — I950 Idiopathic hypotension: Secondary | ICD-10-CM | POA: Diagnosis not present

## 2022-10-11 DIAGNOSIS — R29898 Other symptoms and signs involving the musculoskeletal system: Secondary | ICD-10-CM

## 2022-10-11 NOTE — Patient Instructions (Signed)
Medication Instructions:  Your physician recommends that you continue on your current medications as directed. Please refer to the Current Medication list given to you today.  *If you need a refill on your cardiac medications before your next appointment, please call your pharmacy*   Lab Work: NONE If you have labs (blood work) drawn today and your tests are completely normal, you will receive your results only by: MyChart Message (if you have MyChart) OR A paper copy in the mail If you have any lab test that is abnormal or we need to change your treatment, we will call you to review the results.   Testing/Procedures: NONE   Follow-Up: At [facility], you and your health needs are our priority.  As part of our continuing mission to provide you with exceptional heart care, we have created designated Provider Care Teams.  These Care Teams include your primary Cardiologist (physician) and Advanced Practice Providers (APPs -  Physician Assistants and Nurse Practitioners) who all work together to provide you with the care you need, when you need it.  We recommend signing up for the patient portal called "MyChart".  Sign up information is provided on this After Visit Summary.  MyChart is used to connect with patients for Virtual Visits (Telemedicine).  Patients are able to view lab/test results, encounter notes, upcoming appointments, etc.  Non-urgent messages can be sent to your provider as well.   To learn more about what you can do with MyChart, go to [email or web address].    Your next appointment:   6 month(s)  Provider:   David Harding, MD    

## 2022-11-10 DIAGNOSIS — R350 Frequency of micturition: Secondary | ICD-10-CM | POA: Diagnosis not present

## 2022-11-10 DIAGNOSIS — M545 Low back pain, unspecified: Secondary | ICD-10-CM | POA: Diagnosis not present

## 2022-11-20 ENCOUNTER — Other Ambulatory Visit: Payer: Self-pay | Admitting: Physician Assistant

## 2022-11-20 DIAGNOSIS — D696 Thrombocytopenia, unspecified: Secondary | ICD-10-CM

## 2022-11-21 ENCOUNTER — Inpatient Hospital Stay: Payer: BC Managed Care – PPO | Admitting: Physician Assistant

## 2022-11-21 ENCOUNTER — Inpatient Hospital Stay: Payer: BC Managed Care – PPO | Attending: Physician Assistant

## 2022-12-13 DIAGNOSIS — D2330 Other benign neoplasm of skin of unspecified part of face: Secondary | ICD-10-CM | POA: Diagnosis not present

## 2022-12-13 DIAGNOSIS — H16123 Filamentary keratitis, bilateral: Secondary | ICD-10-CM | POA: Diagnosis not present

## 2022-12-19 DIAGNOSIS — L738 Other specified follicular disorders: Secondary | ICD-10-CM | POA: Diagnosis not present

## 2022-12-19 DIAGNOSIS — D2239 Melanocytic nevi of other parts of face: Secondary | ICD-10-CM | POA: Diagnosis not present

## 2022-12-19 DIAGNOSIS — Z85828 Personal history of other malignant neoplasm of skin: Secondary | ICD-10-CM | POA: Diagnosis not present

## 2022-12-20 DIAGNOSIS — H6123 Impacted cerumen, bilateral: Secondary | ICD-10-CM | POA: Diagnosis not present

## 2022-12-27 DIAGNOSIS — H00014 Hordeolum externum left upper eyelid: Secondary | ICD-10-CM | POA: Diagnosis not present

## 2022-12-27 DIAGNOSIS — M3500 Sicca syndrome, unspecified: Secondary | ICD-10-CM | POA: Diagnosis not present

## 2022-12-27 DIAGNOSIS — H0015 Chalazion left lower eyelid: Secondary | ICD-10-CM | POA: Diagnosis not present

## 2022-12-28 DIAGNOSIS — R922 Inconclusive mammogram: Secondary | ICD-10-CM | POA: Diagnosis not present

## 2023-01-15 DIAGNOSIS — H00014 Hordeolum externum left upper eyelid: Secondary | ICD-10-CM | POA: Diagnosis not present

## 2023-01-15 DIAGNOSIS — H0015 Chalazion left lower eyelid: Secondary | ICD-10-CM | POA: Diagnosis not present

## 2023-01-15 DIAGNOSIS — H16123 Filamentary keratitis, bilateral: Secondary | ICD-10-CM | POA: Diagnosis not present

## 2023-01-15 DIAGNOSIS — M3500 Sicca syndrome, unspecified: Secondary | ICD-10-CM | POA: Diagnosis not present

## 2023-01-24 IMAGING — DX DG CHEST 1V PORT
1 series · 1 of 1 positions shown · non-contrast
Comparison: 04/10/2017.

CLINICAL DATA: 49 y/o female here with c/o abd pain, diarrhea,
nausea x -states she took abx last week for "severe head
congestion"-

EXAM:
PORTABLE CHEST 1 VIEW

[chest ap]
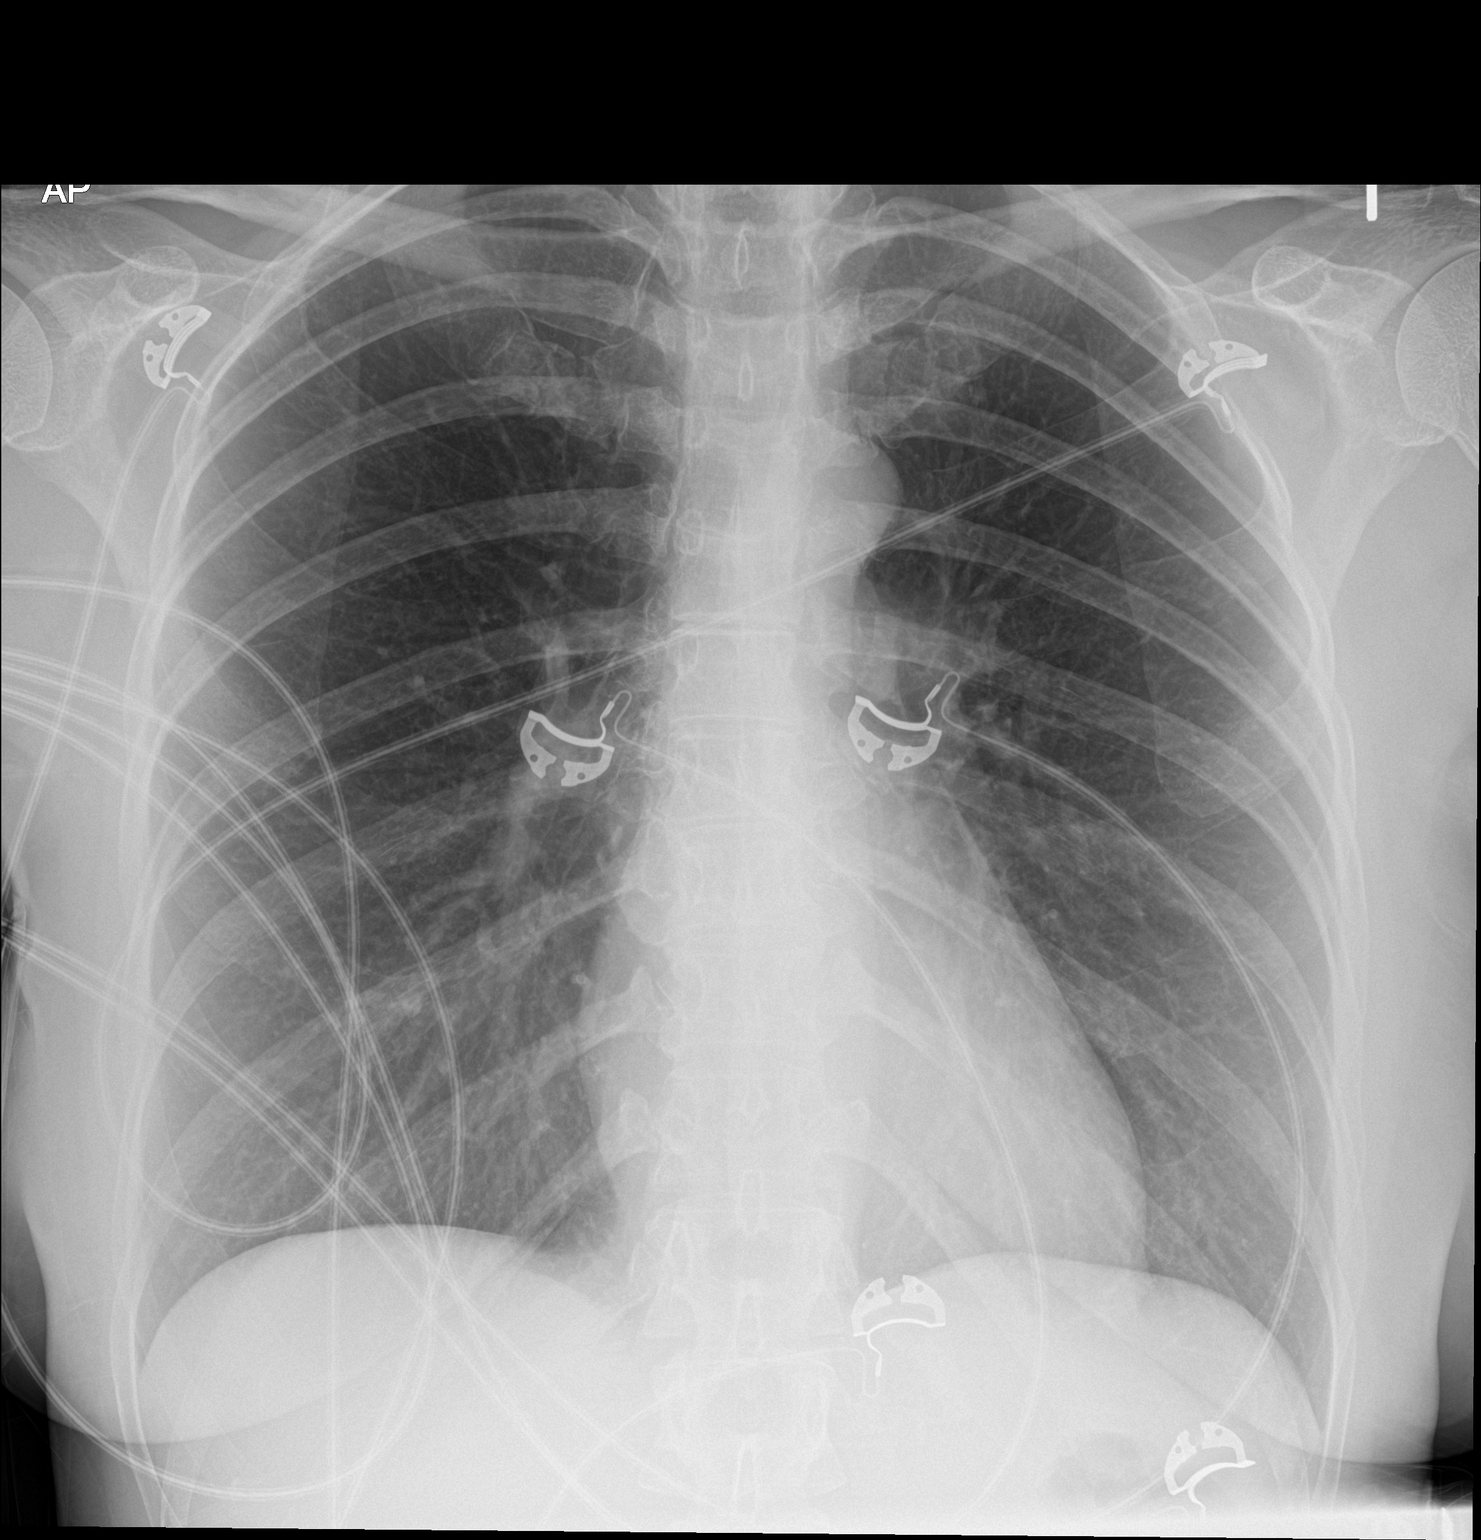

[1 of 1 positions shown; findings below may reference images not displayed]

FINDINGS: Normal heart, mediastinum and hila.

Clear lungs.  No pleural effusion or pneumothorax.

Skeletal structures are grossly intact.
IMPRESSION: No active disease.

## 2023-02-01 IMAGING — MR MR HEAD WO/W CM
14 series · 48 of 48 positions shown · IV contrast (multihance)
Comparison: 07/19/2011

CLINICAL DATA: Tremors, blacking out, blurred vision, weakness in
the legs, and difficulty with ambulation and speech.

EXAM:
MRI HEAD WITHOUT AND WITH CONTRAST
TECHNIQUE: Multiplanar, multiecho pulse sequences of the brain and surrounding
structures were obtained without and with intravenous contrast.
CONTRAST:  10mL MULTIHANCE GADOBENATE DIMEGLUMINE 529 MG/ML IV SOLN

[Series 5: T1 · sagittal · 4.0mm · 0.78mm/px · 1 of 29 slices shown (1 of 3)]
[im 1/29]
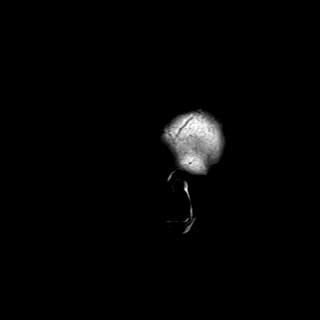

[Series 6: DWI · axial · 3.0mm · 0.94mm/px · z∈[-83,+68]mm · 8 of 171 slices shown (1 of 3)]
[im 1/171]
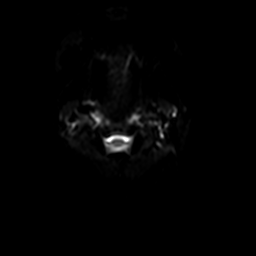
[im 25/171]
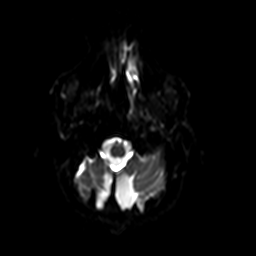
[im 49/171]
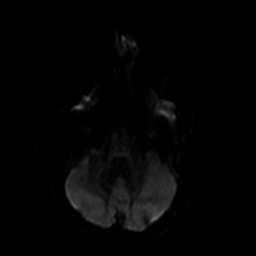
[im 73/171]
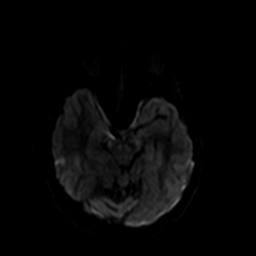
[im 98/171]
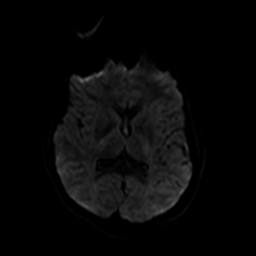
[im 122/171]
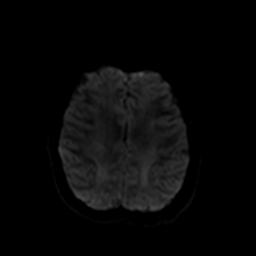
[im 146/171]
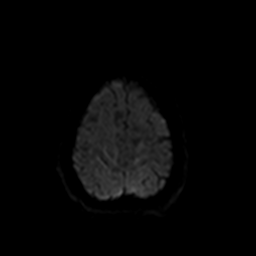
[im 171/171]
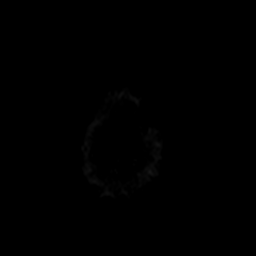

[Series 7: ax dwi_tracew · axial · 3.0mm · 0.94mm/px · z∈[-83,+68]mm · 4 of 85 slices shown]
[im 1/85]
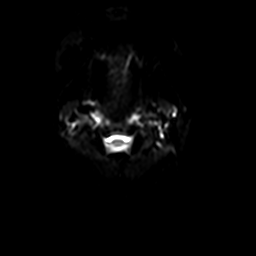
[im 29/85]
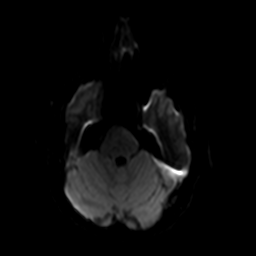
[im 57/85]
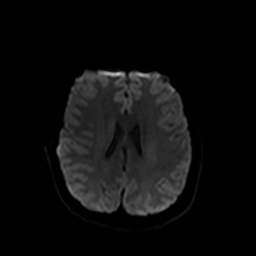
[im 85/85]
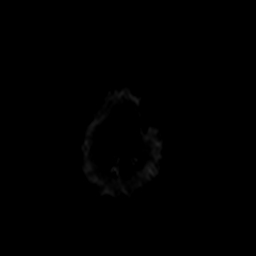

[Series 8: ax dwi_adc · axial · 3.0mm · 0.94mm/px · z∈[-83,+68]mm · 2 of 43 slices shown]
[im 1/43]
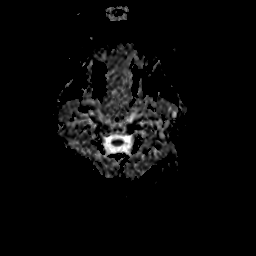
[im 43/43]
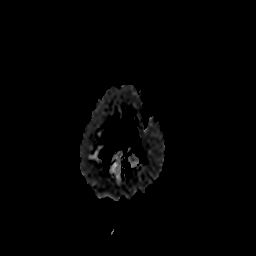

[Series 9: DWI · coronal · 5.0mm · 1.44mm/px · 3 of 66 slices shown (2 of 3)]
[im 1/66]
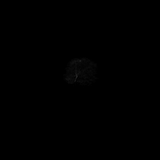
[im 33/66]
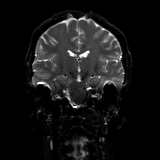
[im 66/66]
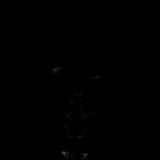

[Series 10: DWI · coronal · 5.0mm · 1.44mm/px · 2 of 33 slices shown (3 of 3)]
[im 1/33]
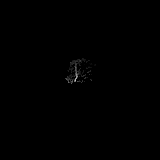
[im 33/33]
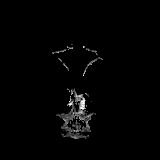

[Series 11: T2 · axial · 4.0mm · 0.38mm/px · 1 of 30 slices shown]
[im 1/30]
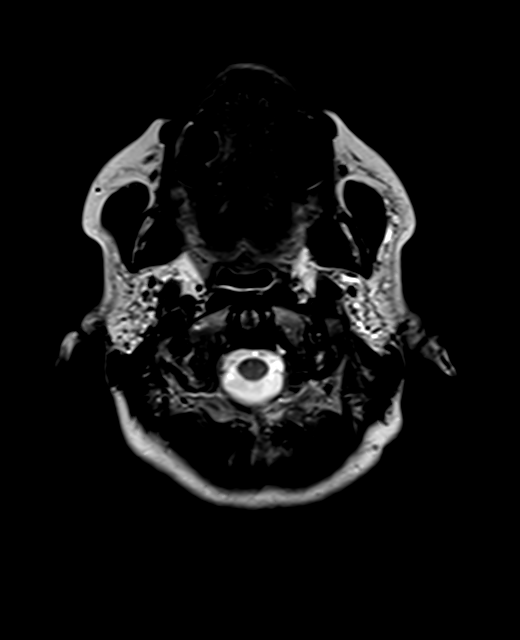

[Series 12: FLAIR · axial · 3.0mm · 0.72mm/px · 1 of 26 slices shown]
[im 1/26]
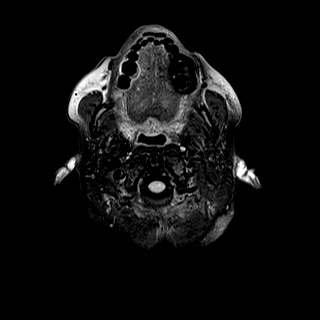

[Series 13: swi_images · axial · 1.5mm · 0.90mm/px · z∈[-84,+71]mm · 5 of 104 slices shown]
[im 1/104]
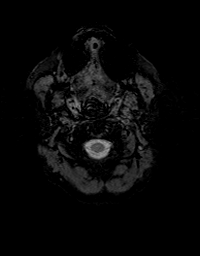
[im 26/104]
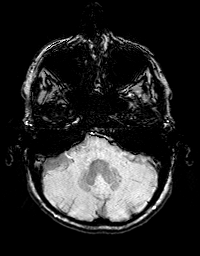
[im 52/104]
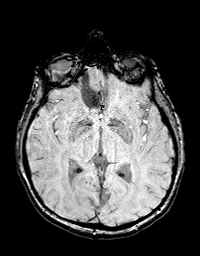
[im 78/104]
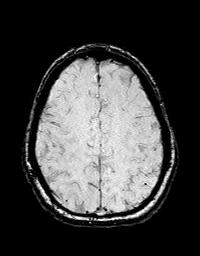
[im 104/104]
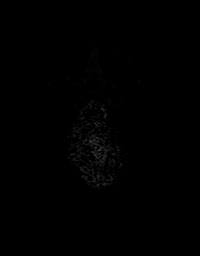

[Series 15: T1 · axial · 1.0mm · 0.94mm/px · z∈[-92,+66]mm · 8 of 160 slices shown (2 of 3)]
[im 1/160]
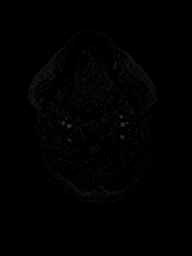
[im 23/160]
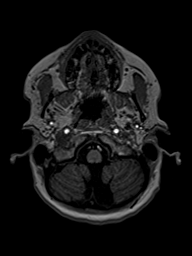
[im 46/160]
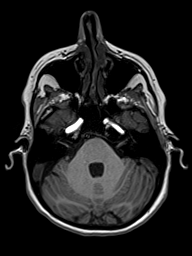
[im 69/160]
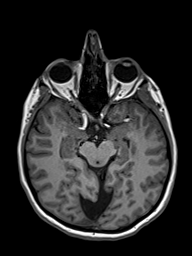
[im 91/160]
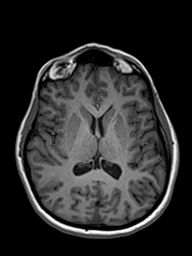
[im 114/160]
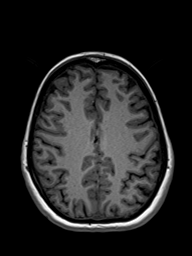
[im 137/160]
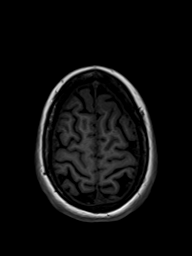
[im 160/160]
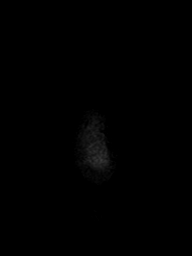

[Series 16: T2 post-contrast · coronal · 5.0mm · 0.36mm/px · 2 of 33 slices shown]
[im 1/33]
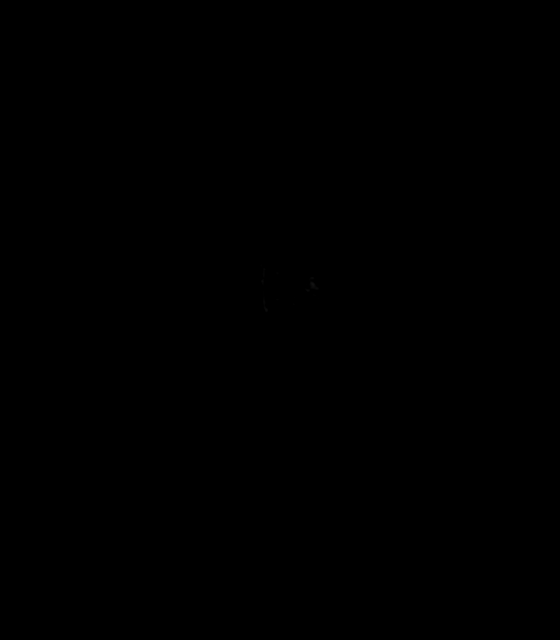
[im 33/33]
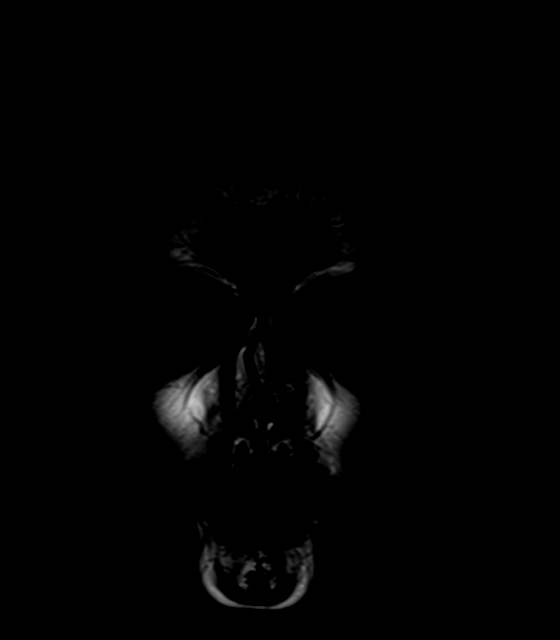

[Series 17: T1 · axial · 1.0mm · 0.94mm/px · z∈[-92,+66]mm · 8 of 160 slices shown (3 of 3)]
[im 1/160]
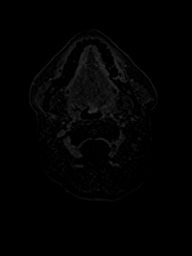
[im 23/160]
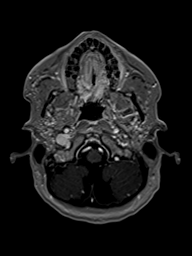
[im 46/160]
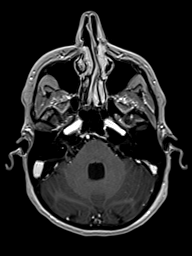
[im 69/160]
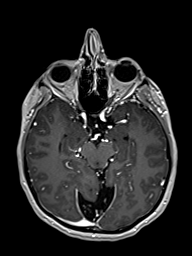
[im 91/160]
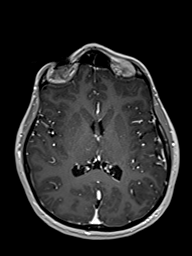
[im 114/160]
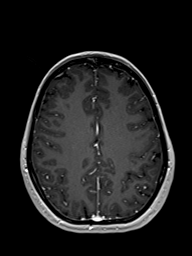
[im 137/160]
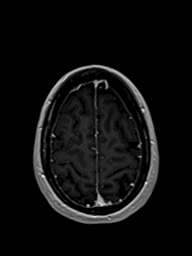
[im 160/160]
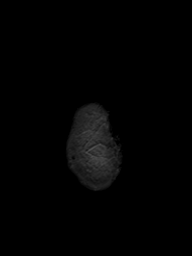

[Series 18: T1 post-contrast · coronal · 5.0mm · 0.72mm/px · 2 of 33 slices shown (1 of 2)]
[im 1/33]
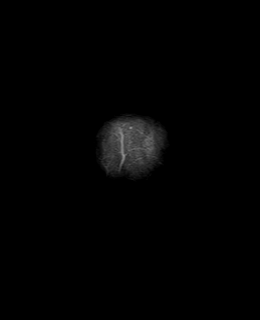
[im 33/33]
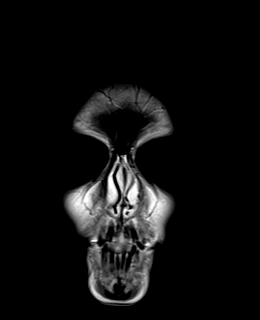

[Series 19: T1 post-contrast · sagittal · 4.0mm · 0.78mm/px · 1 of 29 slices shown (2 of 2)]
[im 1/29]
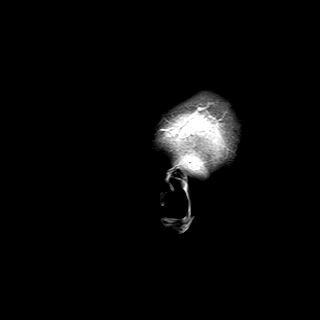

[48 of 48 positions shown; findings below may reference images not displayed]

FINDINGS: Brain: There is no evidence of an acute infarct, intracranial
hemorrhage, midline shift, or extra-axial fluid collection. The
lateral and third ventricles are normal in size. No abnormal
intracranial enhancement is identified.

An abnormal appearance of the posterior fossa is unchanged and
likely congenital as described on the prior MRI. This includes
moderate enlargement of the fourth ventricle with decreased
cerebellar volume, prominence of the superior cerebellar cistern,
and abnormal occipital lobe gyral pattern with herniation of
portions of the inferomedial occipital lobes through hypoplastic
tentorium (right greater than left). Asymmetric extra-axial CSF in
in the right cerebellopontine angle region is unchanged and
suggestive of a small arachnoid cyst. Mild enlargement of the
cisterna magna, most notable to the left of midline, is unchanged. A
2 mm T1 and FLAIR hyperintense nodule in the left cerebellopontine
angle along the medial aspect of the left trigeminal nerve is
unchanged and may represent a tiny lipoma.

Vascular: Major intracranial vascular flow voids are preserved.

Skull and upper cervical spine: Unremarkable bone marrow signal.

Sinuses/Orbits: Unremarkable orbits. Paranasal sinuses and mastoid
air cells are clear.

Other: None.
IMPRESSION: 1. No acute intracranial abnormality.
2. Previously described congenital anomaly involving the posterior
fossa and occipital lobes.

## 2023-03-15 ENCOUNTER — Other Ambulatory Visit: Payer: Self-pay | Admitting: Cardiology

## 2023-03-16 ENCOUNTER — Telehealth: Payer: Self-pay

## 2023-03-16 MED ORDER — MIDODRINE HCL 2.5 MG PO TABS: 2.5000 mg | ORAL_TABLET | Freq: Two times a day (BID) | ORAL | 6 refills | Status: AC

## 2023-03-16 NOTE — Telephone Encounter (Signed)
RX sent. Patient is aware.

## 2023-03-29 DIAGNOSIS — Z79899 Other long term (current) drug therapy: Secondary | ICD-10-CM | POA: Diagnosis not present

## 2023-03-29 DIAGNOSIS — H5213 Myopia, bilateral: Secondary | ICD-10-CM | POA: Diagnosis not present

## 2023-03-29 DIAGNOSIS — H16123 Filamentary keratitis, bilateral: Secondary | ICD-10-CM | POA: Diagnosis not present

## 2023-03-29 DIAGNOSIS — H04123 Dry eye syndrome of bilateral lacrimal glands: Secondary | ICD-10-CM | POA: Diagnosis not present

## 2023-03-29 DIAGNOSIS — H25813 Combined forms of age-related cataract, bilateral: Secondary | ICD-10-CM | POA: Diagnosis not present

## 2023-03-30 DIAGNOSIS — M5440 Lumbago with sciatica, unspecified side: Secondary | ICD-10-CM | POA: Diagnosis not present

## 2023-03-30 DIAGNOSIS — M3501 Sicca syndrome with keratoconjunctivitis: Secondary | ICD-10-CM | POA: Diagnosis not present

## 2023-03-30 DIAGNOSIS — R21 Rash and other nonspecific skin eruption: Secondary | ICD-10-CM | POA: Diagnosis not present

## 2023-03-30 DIAGNOSIS — D696 Thrombocytopenia, unspecified: Secondary | ICD-10-CM | POA: Diagnosis not present

## 2023-04-16 NOTE — Progress Notes (Unsigned)
Cardiology Clinic Note   Patient Name: Sarah Garza Date of Encounter: 04/18/2023  Primary Care Provider:  Sigmund Hazel, MD Primary Cardiologist:  Bryan Lemma, MD  Patient Profile    Sarah Garza 52 year old female presents to the clinic today for follow-up evaluation of her palpitations.  Past Medical History    Past Medical History:  Diagnosis Date   Arterial hypotension 10/17/2021   ITP (idiopathic thrombocytopenic purpura)    Low back pain 02/15/2016   Rheumatoid aortitis    Sjoegren syndrome    Syncope    Likely related to hypotension   Past Surgical History:  Procedure Laterality Date   FOOT SURGERY     HERNIA REPAIR     umbilical   TRANSTHORACIC ECHOCARDIOGRAM  10/17/2021   EF 55 to 60%.  Normal LV function with no RWMA.  Normal diastolic parameters.  Normal RV with RVP.  Normal aortic and mitral valves.  Normal RAP.    Allergies  Allergies  Allergen Reactions   Cefdinir Shortness Of Breath and Diarrhea   Doxycycline Hyclate     Other reaction(s): severe thrush when on doxycycline for a long time.   Gluten Meal    Levaquin [Levofloxacin] Other (See Comments)    Joint pain   Mucinex [Guaifenesin Er] Other (See Comments)    "Felt like I was going to pass out"   Penicillins Hives   Robaxin [Methocarbamol]     Caused hotness, and nausea     History of Present Illness    Sarah Garza has a PMH of atrial hypertension, cardiac murmur, palpitations, DOE, and bilateral lower extremity edema.  She was seen in follow-up by Dr. Herbie Baltimore on 12/21/2021.  During the interim her blood pressure continued to be low.  She reported dizziness.  She was started on midodrine 2.5 mg in the a.m. for symptoms and dizziness.  She was instructed to liberalize her salt and continue to wear lower extremity support stockings.  Her echocardiogram was noted to be normal.  She contacted the nurse triage line on 05/01/2022.  She indicated that she had weakness in her  legs and felt dizzy.  She indicated some lower extremity pain but denied redness.  She reported that when she would remove her compression stockings she would immediately feel rushing going to her lower extremities and heaviness.  She also reported episodes of forgetting what she was talking about.  Her blood pressure was 90/64 and she had not yet taken her midodrine.  She presented to the clinic  for follow-up 05/05/22 and stated she contracted COVID 5 times.  With her final episode of COVID she developed an infection and was prescribed doxycycline.  She had struggles with constipation since that time as well as blood pressure issues.  She reported that she used to be very physically active and was subsequently limited in her physical activity due to her symptoms.  She also noted chronic constipation which she took stool softener and laxatives for.  She reported that she had increased the fiber in her diet and drank close to 100 ounces of fluid daily.  She supplemented her hydration with electrolyte drinks as well.  She was worried about decreased sodium.  She continued to note decreased blood pressure.  She had cerumen removed and noted increased dizziness with the procedure (PCP).  She visit planned with ENT on August 28.  GI follow-up was also planned.  I increased her midodrine to twice daily, had her maintain her hydration and  electrolyte supplementation, increased supine/recumbent type activities, continued lower extremity support stockings and plan follow-up in 2 to 3 months.  She was last seen in follow-up by Bettina Gavia PA-C on 10/11/2022.  She was being seen for follow-up of her cardiac monitor which did not show significant arrhythmia.  She had been seen by Dr. Herbie Baltimore 10/23.  She reported chest pain and palpitations.  Her symptoms while wearing her monitor were generally associated with sinus rhythm but A. tach was also noted in short runs.  She continued to have chest pain.  She also noted leg  heaviness and not walking straight at times.  She reported a sensation of floating.  She had been evaluated by neurology.  She denied leg pain.  She denied symptoms of claudication.  She was noted to have strong pedal pulses and was not noted to be fluid volume overloaded.  She tried to wean off of Plaquenil but developed joint pain.  She was perimenopausal and was felt to have intermittent hot flashes.  She presents to the clinic today for follow-up evaluation and states she is noticing below-knee tingling and numbness.  She has been compliant with lower extremity support stockings.  We reviewed her prior echocardiogram and cardiac event monitor.  She reports previous back injury and long history of manual labor type jobs.  She was previously seen and evaluated by neurology.  She wishes to defer further neurology evaluation and treatment at this time.  We discussed the possibility of management with traction type activities and physical therapy.  She agrees to proceed.  I will place a referral for physical therapy evaluation and treatment for back pain and lower extremity tingling.  We will plan cardiology follow-up in 9 to 12 months.  Today she denies chest pain, shortness of breath, lower extremity edema, fatigue, palpitations, melena, hematuria, hemoptysis, diaphoresis, weakness, presyncope, syncope, orthopnea, and PND.     Home Medications    Prior to Admission medications   Medication Sig Start Date End Date Taking? Authorizing Provider  acetaminophen (TYLENOL) 500 MG tablet Take 500 mg by mouth every 6 (six) hours as needed for moderate pain or headache.    [provider]  Biotin 5000 MCG TABS Take 5,000 mcg by mouth daily.    [provider]  clonazePAM (KLONOPIN) 0.5 MG tablet Take 0.5 tablets (0.25 mg total) by mouth at bedtime. Patient not taking: Reported on 12/21/2021 12/01/21   Penumalli, Glenford Bayley, MD  cyclobenzaprine (FLEXERIL) 5 MG tablet Take 5 mg by mouth daily.  11/23/21   [provider]  hydroxychloroquine (PLAQUENIL) 200 MG tablet Take 200 mg by mouth daily.    [provider]  midodrine (PROAMATINE) 2.5 MG tablet Take 1 tablet (2.5 mg total) by mouth daily. May take an additional 2.5 mg in the evening if needed daily 12/21/21   Marykay Lex, MD  naproxen sodium (ALEVE) 220 MG tablet Take 220-440 mg by mouth daily as needed (cramps).    [provider]  Propylene Glycol (SYSTANE BALANCE OP) Apply to eye.    [provider]  Vitamin D, Ergocalciferol, (DRISDOL) 1.25 MG (50000 UNIT) CAPS capsule Take 50,000 Units by mouth every Friday. 03/25/20   [provider]    Family History    Family History  Problem Relation Age of Onset   Hypothyroidism Mother    COPD Father    Cirrhosis Father    Valvular heart disease Father        S/p MVR  Sjogren's syndrome Sister    Rheum arthritis Sister    Lung cancer Maternal Grandmother    Heart attack Maternal Grandfather    CAD Maternal Grandfather    She indicated that her mother is alive. She indicated that her father is deceased. She indicated that her sister is alive. She indicated that her maternal grandmother is deceased. She indicated that her maternal grandfather is deceased. She indicated that her paternal grandmother is deceased. She indicated that her paternal grandfather is deceased.  Social History    Social History   Socioeconomic History   Marital status: Single    Spouse name: Not on file   Number of children: 0   Years of education: 16   Highest education level: Not on file  Occupational History   Not on file  Tobacco Use   Smoking status: Never   Smokeless tobacco: Never  Vaping Use   Vaping status: Never Used  Substance and Sexual Activity   Alcohol use: No   Drug use: No   Sexual activity: Not on file  Other Topics Concern   Not on file  Social History Narrative   Lives at home w/ her mother   Right-handed   Drinks 1 cup of  coffee daily   Social Determinants of Health   Financial Resource Strain: Low Risk  (02/13/2022)   Received from [facility], [facility]   Overall Financial Resource Strain (CARDIA)    Difficulty of Paying Living Expenses: Not hard at all  Food Insecurity: No Food Insecurity (02/13/2022)   Received from [facility], [facility]   Hunger Vital Sign    Worried About Running Out of Food in the Last Year: Never true    Ran Out of Food in the Last Year: Never true  Transportation Needs: No Transportation Needs (02/13/2022)   Received from [facility], [facility]   [facility] - Transportation    In the past 12 months, has lack of transportation kept you from medical appointments or from getting medications?: No    Lack of Transportation (Non-Medical): No  Physical Activity: Sufficiently Active (03/30/2021)   Received from [facility], [facility]   Exercise Vital Sign    Days of Exercise per Week: 5 days    Minutes of Exercise per Session: 40 min  Stress: Stress Concern Present (03/30/2021)   Received from [facility], [facility]   [employer] [facility] - Occupational Stress Questionnaire    Feeling of Stress : To some extent  Social Connections: Not on file  Intimate Partner Violence: Not on file     Review of Systems    General:  No chills, fever, night sweats or weight changes.  Cardiovascular:  No chest pain, dyspnea on exertion, edema, orthopnea, palpitations, paroxysmal nocturnal dyspnea. Dermatological: No rash, lesions/masses Respiratory: No cough, dyspnea Urologic: No hematuria, dysuria Abdominal:   No nausea, vomiting, diarrhea, bright red blood per rectum, melena, or hematemesis Neurologic:  No visual changes, wkns, changes in mental status. All other systems reviewed and are  otherwise negative except as noted above.  Physical Exam    VS:  BP 104/66 (BP Location: Left Arm, Patient Position: Sitting, Cuff Size: Normal)   Pulse 70   Ht 5\' 4"  (1.626 m)   Wt 121 lb 3.2 oz (55 kg)   BMI 20.80 kg/m  , BMI Body mass index is 20.8  kg/m. GEN: Well nourished, well developed, in no acute distress. HEENT: normal. Neck: Supple, no JVD, carotid bruits, or masses. Cardiac: RRR, no murmurs, rubs, or gallops. No clubbing, cyanosis, edema.  Radials/DP/PT 2+ and equal bilaterally.  Respiratory:  Respirations regular and unlabored, clear to auscultation bilaterally. GI: Soft, nontender, nondistended, BS + x 4. MS: no deformity or atrophy. Skin: warm and dry, no rash. Neuro:  Strength and sensation are intact. Psych: Normal affect.  Accessory Clinical Findings    Recent Labs: 05/16/2022: ALT 24; BUN 14; Creatinine 0.76; Hemoglobin 11.5; Platelet Count 77; Potassium 3.9; Sodium 138   Recent Lipid Panel No results found for: "CHOL", "TRIG", "HDL", "CHOLHDL", "VLDL", "LDLCALC", "LDLDIRECT"  ECG personally reviewed by me today-none today.  Echocardiogram 10/17/2021 IMPRESSIONS     1. Left ventricular ejection fraction, by estimation, is 55 to 60%. Left  ventricular ejection fraction by 3D volume is 55 %. The left ventricle has  normal function. The left ventricle has no regional wall motion  abnormalities. Left ventricular diastolic   parameters were normal.   2. Right ventricular systolic function is normal. The right ventricular  size is normal. There is normal pulmonary artery systolic pressure.   3. The mitral valve is normal in structure. No evidence of mitral valve  regurgitation. No evidence of mitral stenosis.   4. The aortic valve is tricuspid. Aortic valve regurgitation is not  visualized. No aortic stenosis is present.   5. The inferior vena cava is normal in size with greater than 50%  respiratory variability, suggesting right atrial pressure of 3 mmHg.    Comparison(s): No prior Echocardiogram.  Cardiac event monitor 08/08/2022    Predominant underlying rhythm: Sinus Rhythm (1  AVB): HR range 47-136 bpm, avg 72 bpm   Rare (<1%) PACs and PVCs.  No PVC bigeminy/trigeminy.   3 Atrial Runs: Fastest 4 beats w/ HR Range 124-133 bpm, Avg 129 bpm, 1.9 sec; Longest 7 beats w/ HR Range 102-115 bpm, Avg 109 bpm, 3.9 sec   No Sustained Arrhythmias: Atrial Tachycardia (AT), Supraventricular Tachycardia (SVT), Atrial Fibrillation (A-Fib), Atrial Flutter (A-Flutter), Sustained Ventricular Tachycardia (VT)   Symptoms are noted mostly with sinus rhythm.  Occasional fluttering noted with sinus rhythm and PVC as well as the 3 short atrial runs..   Overall, pretty benign study with no gross abnormalities.  Symptoms mostly noted with benign findings on monitor.   Would simply monitor for progression of symptoms & would not recommend medical management.   Assessment & Plan   1.  Back pain, lower extremity tingling/heaviness-reports prior back injury.  Was previously evaluated by neurology.  Also notices below knee heaviness and tingling.  Offered referral back to neurology.  Wishes to defer neurology evaluation and pursue physical therapy. Physical therapy evaluation and treat Reassured that symptoms are not related to cardiac issues  Arterial hypotension, dizziness-denies recent increased presyncope or syncope.  Continues to maintain hydration.   Lower extremity support stockings Continue midodrine 2.5 mg twice daily Maintain rowing activities and recumbent type activities Change positions slowly.  Chest discomfort-none today.  Denies exertional chest discomfort.  Atypical in nature.  Continue heart healthy diet.   Maintain physical activity.    Palpitations-heart rate today 70.  Cardiac event monitor showed predominantly normal sinus rhythm with short runs of atrial tachycardia.  Details above.  Continues to note palpitations with getting up in  the morning occasionally, palpitations dissipate on their own without intervention  Change positions slowly Maintain physical activity as tolerated Maintain p.o.  hydration  DOE-denies increased work of breathing.  Echocardiogram 1/23 showed normal EF.  Euvolemic.  Weight stable.  No increased DOE or activity intolerance.  Does not appear to be cardiac related, patient reassured  Disposition: Follow-up with Dr. Herbie Baltimore in 9-12 months.   Thomasene Ripple. Colleen Donahoe NP-C     04/18/2023, 8:26 AM [facility] [address] Office [phone] Fax [phone]  Notice: This dictation was prepared with Dragon dictation along with smaller phrase technology. Any transcriptional errors that result from this process are unintentional and may not be corrected upon review.  I spent 14 minutes examining this patient, reviewing medications, and using patient centered shared decision making involving her cardiac care.  Prior to her visit I spent greater than 20 minutes reviewing her past medical history,  medications, and prior cardiac tests.

## 2023-04-18 ENCOUNTER — Ambulatory Visit: Payer: BC Managed Care – PPO | Attending: General Practice | Admitting: General Practice

## 2023-04-18 ENCOUNTER — Encounter: Payer: Self-pay | Admitting: General Practice

## 2023-04-18 VITALS — BP 104/66 | HR 70 | Ht 64.0 in | Wt 121.2 lb

## 2023-04-18 DIAGNOSIS — I950 Idiopathic hypotension: Secondary | ICD-10-CM

## 2023-04-18 DIAGNOSIS — R42 Dizziness and giddiness: Secondary | ICD-10-CM

## 2023-04-18 DIAGNOSIS — R002 Palpitations: Secondary | ICD-10-CM

## 2023-04-18 DIAGNOSIS — R0609 Other forms of dyspnea: Secondary | ICD-10-CM

## 2023-04-18 DIAGNOSIS — R079 Chest pain, unspecified: Secondary | ICD-10-CM

## 2023-04-18 DIAGNOSIS — M5440 Lumbago with sciatica, unspecified side: Secondary | ICD-10-CM

## 2023-04-18 DIAGNOSIS — R202 Paresthesia of skin: Secondary | ICD-10-CM

## 2023-04-18 DIAGNOSIS — R200 Anesthesia of skin: Secondary | ICD-10-CM

## 2023-04-18 DIAGNOSIS — G8929 Other chronic pain: Secondary | ICD-10-CM

## 2023-04-18 DIAGNOSIS — R29898 Other symptoms and signs involving the musculoskeletal system: Secondary | ICD-10-CM

## 2023-04-18 NOTE — Patient Instructions (Signed)
Medication Instructions:  The current medical regimen is effective;  continue present plan and medications as directed. Please refer to the Current Medication list given to you today. *If you need a refill on your cardiac medications before your next appointment, please call your pharmacy*  Lab Work: NONE If you have labs (blood work) drawn today and your tests are completely normal, you will receive your results only by: MyChart Message (if you have MyChart) OR A paper copy in the mail If you have any lab test that is abnormal or we need to change your treatment, we will call you to review the results.  Testing/Procedures: REFERRAL TO PHYSICAL THERAPY  Follow-Up: At [facility], you and your health needs are our priority.  As part of our continuing mission to provide you with exceptional heart care, we have created designated Provider Care Teams.  These Care Teams include your primary Cardiologist (physician) and Advanced Practice Providers (APPs -  Physician Assistants and Nurse Practitioners) who all work together to provide you with the care you need, when you need it.  Your next appointment:   12 month(s)  Provider:   Bryan Lemma, MD     Other Instructions

## 2023-04-20 DIAGNOSIS — E559 Vitamin D deficiency, unspecified: Secondary | ICD-10-CM | POA: Diagnosis not present

## 2023-04-20 DIAGNOSIS — Z0000 Encounter for general adult medical examination without abnormal findings: Secondary | ICD-10-CM | POA: Diagnosis not present

## 2023-04-20 DIAGNOSIS — Z13220 Encounter for screening for lipoid disorders: Secondary | ICD-10-CM | POA: Diagnosis not present

## 2023-05-07 DIAGNOSIS — M546 Pain in thoracic spine: Secondary | ICD-10-CM | POA: Diagnosis not present

## 2023-05-07 DIAGNOSIS — R1013 Epigastric pain: Secondary | ICD-10-CM | POA: Diagnosis not present

## 2023-05-24 ENCOUNTER — Ambulatory Visit (HOSPITAL_BASED_OUTPATIENT_CLINIC_OR_DEPARTMENT_OTHER): Payer: BC Managed Care – PPO | Admitting: Physical Therapy

## 2023-06-01 ENCOUNTER — Inpatient Hospital Stay: Payer: BC Managed Care – PPO | Admitting: Physician Assistant

## 2023-06-01 ENCOUNTER — Inpatient Hospital Stay: Payer: BC Managed Care – PPO | Attending: Physician Assistant

## 2023-06-01 VITALS — BP 106/57 | HR 74 | Temp 98.4°F | Resp 16 | Ht 64.0 in | Wt 121.0 lb

## 2023-06-01 DIAGNOSIS — D693 Immune thrombocytopenic purpura: Secondary | ICD-10-CM | POA: Insufficient documentation

## 2023-06-01 DIAGNOSIS — Z801 Family history of malignant neoplasm of trachea, bronchus and lung: Secondary | ICD-10-CM | POA: Insufficient documentation

## 2023-06-01 DIAGNOSIS — D696 Thrombocytopenia, unspecified: Secondary | ICD-10-CM

## 2023-06-01 LAB — CBC WITH DIFFERENTIAL (CANCER CENTER ONLY)
Abs Immature Granulocytes: 0.01 10*3/uL (ref 0.00–0.07)
Basophils Absolute: 0.1 10*3/uL (ref 0.0–0.1)
Basophils Relative: 1 %
Eosinophils Absolute: 0.1 10*3/uL (ref 0.0–0.5)
Eosinophils Relative: 1 %
HCT: 40.1 % (ref 36.0–46.0)
Hemoglobin: 13 g/dL (ref 12.0–15.0)
Immature Granulocytes: 0 %
Lymphocytes Relative: 19 %
Lymphs Abs: 1 10*3/uL (ref 0.7–4.0)
MCH: 29.7 pg (ref 26.0–34.0)
MCHC: 32.4 g/dL (ref 30.0–36.0)
MCV: 91.8 fL (ref 80.0–100.0)
Monocytes Absolute: 0.6 10*3/uL (ref 0.1–1.0)
Monocytes Relative: 11 %
Neutro Abs: 3.6 10*3/uL (ref 1.7–7.7)
Neutrophils Relative %: 68 %
Platelet Count: 90 10*3/uL — ABNORMAL LOW (ref 150–400)
RBC: 4.37 MIL/uL (ref 3.87–5.11)
RDW: 13.2 % (ref 11.5–15.5)
WBC Count: 5.3 10*3/uL (ref 4.0–10.5)
nRBC: 0 % (ref 0.0–0.2)

## 2023-06-01 LAB — CMP (CANCER CENTER ONLY)
ALT: 24 U/L (ref 0–44)
AST: 25 U/L (ref 15–41)
Albumin: 3.9 g/dL (ref 3.5–5.0)
Alkaline Phosphatase: 49 U/L (ref 38–126)
Anion gap: 3 — ABNORMAL LOW (ref 5–15)
BUN: 19 mg/dL (ref 6–20)
CO2: 29 mmol/L (ref 22–32)
Calcium: 9.2 mg/dL (ref 8.9–10.3)
Chloride: 107 mmol/L (ref 98–111)
Creatinine: 0.9 mg/dL (ref 0.44–1.00)
GFR, Estimated: >60 mL/min (ref >60)
Glucose, Bld: 79 mg/dL (ref 70–99)
Potassium: 4.4 mmol/L (ref 3.5–5.1)
Sodium: 139 mmol/L (ref 135–145)
Total Bilirubin: 0.7 mg/dL (ref 0.3–1.2)
Total Protein: 7.7 g/dL (ref 6.5–8.1)

## 2023-06-01 NOTE — Progress Notes (Signed)
[facility] [phone]) [phone]   [phone]  PROGRESS NOTE  Patient Care Team: Sigmund Hazel, MD as PCP - General (Family Medicine) Marykay Lex, MD as PCP - Cardiology (Cardiology) Donnetta Hail, MD as Consulting Physician (Rheumatology) Duke, Roe Rutherford, PA as Physician Assistant (Cardiology)   CHIEF COMPLAINTS/PURPOSE OF CONSULTATION:  ITP  HISTORY OF PRESENTING ILLNESS:  Sarah Garza 52 y.o. female returns for a follow up for management of ITP. She  is unaccompanied for this visit.   Sarah Garza reports she is doing well without any changes to her heatlh. Her appetite and energy are stable. She does bruise easily but that has been stable for quite some time. She denies any overt signs of bleeding including hematochezia, melena, nosebleeds, etc. She is currently struggling with some post prandial abdominal bloating and discomfort. She denies shortness of breath, chest pain, cough. She has no other complaints. Rest of the 10 point ROS is below.   MEDICAL HISTORY:  Past Medical History:  Diagnosis Date   Arterial hypotension 10/17/2021   ITP (idiopathic thrombocytopenic purpura)    Low back pain 02/15/2016   Rheumatoid aortitis    Sjoegren syndrome    Syncope    Likely related to hypotension    SURGICAL HISTORY: Past Surgical History:  Procedure Laterality Date   FOOT SURGERY     HERNIA REPAIR     umbilical   TRANSTHORACIC ECHOCARDIOGRAM  10/17/2021   EF 55 to 60%.  Normal LV function with no RWMA.  Normal diastolic parameters.  Normal RV with RVP.  Normal aortic and mitral valves.  Normal RAP.    SOCIAL HISTORY: Social History   Socioeconomic History   Marital status: Single    Spouse name: Not on file   Number of children: 0   Years of education: 16   Highest education level: Not on file  Occupational History   Not on file  Tobacco Use   Smoking status: Never   Smokeless tobacco: Never  Vaping Use   Vaping status:  Never Used  Substance and Sexual Activity   Alcohol use: No   Drug use: No   Sexual activity: Not on file  Other Topics Concern   Not on file  Social History Narrative   Lives at home w/ her mother   Right-handed   Drinks 1 cup of coffee daily   Social Determinants of Health   Financial Resource Strain: Low Risk  (02/13/2022)   Received from [facility], [facility]   Overall Financial Resource Strain (CARDIA)    Difficulty of Paying Living Expenses: Not hard at all  Food Insecurity: No Food Insecurity (02/13/2022)   Received from [facility], [facility]   Hunger Vital Sign    Worried About Running Out of Food in the Last Year: Never true    Ran Out of Food in the Last Year: Never true  Transportation Needs: No Transportation Needs (02/13/2022)   Received from [facility], [facility]   [facility] - Transportation    In the past 12 months, has lack of transportation kept you from medical appointments or from getting medications?: No    Lack of Transportation (Non-Medical): No  Physical Activity: Sufficiently Active (03/30/2021)   Received from [facility], [facility]   Exercise Vital Sign    Days of Exercise per Week: 5 days    Minutes of Exercise per  Session: 40 min  Stress: Stress Concern Present (03/30/2021)   Received from [facility], [facility]   [employer] [facility] [employer] - Occupational Stress Questionnaire    Feeling of Stress : To some extent  Social Connections: Not on file  Intimate Partner Violence: Not on file    FAMILY HISTORY: Family History  Problem Relation Age of Onset   Hypothyroidism Mother    COPD Father    Cirrhosis Father    Valvular heart disease Father        S/p MVR   Sjogren's syndrome Sister    Rheum arthritis Sister    Lung cancer Maternal Grandmother     Heart attack Maternal Grandfather    CAD Maternal Grandfather     ALLERGIES:  is allergic to cefdinir, doxycycline hyclate, gluten meal, levaquin [levofloxacin], mucinex [guaifenesin er], penicillins, and robaxin [methocarbamol].  MEDICATIONS:  Current Outpatient Medications  Medication Sig Dispense Refill   BIOTIN PO Take by mouth daily.     Calcium Carbonate (CALCIUM 500 PO) Take by mouth daily.     hydroxychloroquine (PLAQUENIL) 200 MG tablet Take 200 mg by mouth daily.     midodrine (PROAMATINE) 2.5 MG tablet Take 1 tablet (2.5 mg total) by mouth in the morning and at bedtime. May take an additional 2.5 mg in the evening if needed daily 75 tablet 6   Misc Natural Products (OSTEO BI-FLEX ADV JOINT SHIELD PO) Take by mouth. With glucosamine and chrondroitin     Multiple Vitamin (MULTIVITAMIN) tablet Take 1 tablet by mouth daily.     naproxen sodium (ALEVE) 220 MG tablet Take 220-440 mg by mouth daily as needed (cramps).     Propylene Glycol (SYSTANE BALANCE OP) Apply to eye.     Vitamin D, Ergocalciferol, (DRISDOL) 1.25 MG (50000 UNIT) CAPS capsule Take 50,000 Units by mouth every Friday.     No current facility-administered medications for this visit.    REVIEW OF SYSTEMS:   Constitutional: ( - ) fevers, ( - )  chills , ( - ) night sweats Eyes: ( - ) blurriness of vision, ( - ) double vision, ( - ) watery eyes Ears, nose, mouth, throat, and face: ( - ) mucositis, ( - ) sore throat Respiratory: ( - ) cough, ( - ) dyspnea, ( - ) wheezes Cardiovascular: ( - ) palpitation, ( - ) chest discomfort, ( - ) lower extremity swelling Gastrointestinal:  ( - ) nausea, ( - ) heartburn, ( - ) change in bowel habits Skin: ( - ) abnormal skin rashes Lymphatics: ( - ) new lymphadenopathy, ( - ) easy bruising Neurological: ( - ) numbness, ( - ) tingling, ( - ) new weaknesses Behavioral/Psych: ( - ) mood change, ( - ) new changes  All other systems were reviewed with the patient and are  negative.  PHYSICAL EXAMINATION: ECOG PERFORMANCE STATUS: 0 - Asymptomatic  Vitals:   06/01/23 1134  BP: (!) 106/57  Pulse: 74  Resp: 16  Temp: 98.4 F (36.9 C)  SpO2: 99%   Filed Weights   06/01/23 1134  Weight: 121 lb (54.9 kg)    GENERAL: well appearing female in NAD  SKIN: skin color, texture, turgor are normal, no rashes or significant lesions EYES: conjunctiva are pink and non-injected, sclera clear LUNGS: clear to auscultation and percussion with normal breathing effort HEART: regular rate & rhythm and no murmurs and no lower extremity edema ABDOMEN: soft, non-tender, non-distended, normal bowel sounds Musculoskeletal:  no cyanosis of digits and no clubbing  PSYCH: alert & oriented x 3, fluent speech NEURO: no focal motor/sensory deficits  LABORATORY DATA:  I have reviewed the data as listed    Latest Ref Rng & Units 06/01/2023   11:05 AM 05/16/2022   10:53 AM 10/02/2021    8:58 PM  CBC  WBC 4.0 - 10.5 K/uL 5.3  4.7  6.8   Hemoglobin 12.0 - 15.0 g/dL 96.0  45.4  09.8   Hematocrit 36.0 - 46.0 % 40.1  34.7  34.7   Platelets 150 - 400 K/uL 90  77  102        Latest Ref Rng & Units 06/01/2023   11:05 AM 05/16/2022   10:53 AM 10/02/2021    8:58 PM  CMP  Glucose 70 - 99 mg/dL 79  75  119   BUN 6 - 20 mg/dL 19  14  20    Creatinine 0.44 - 1.00 mg/dL 1.47  8.29  5.62   Sodium 135 - 145 mmol/L 139  138  140   Potassium 3.5 - 5.1 mmol/L 4.4  3.9  3.9   Chloride 98 - 111 mmol/L 107  109  108   CO2 22 - 32 mmol/L 29  27  25    Calcium 8.9 - 10.3 mg/dL 9.2  8.7  8.9   Total Protein 6.5 - 8.1 g/dL 7.7  7.0    Total Bilirubin 0.3 - 1.2 mg/dL 0.7  0.8    Alkaline Phos 38 - 126 U/L 49  42    AST 15 - 41 U/L 25  24    ALT 0 - 44 U/L 24  24     ASSESSMENT & PLAN Sarah Garza is a 52 y.o. female returns for a follow up for ITP.   #ITP, chronic: --Patient has not undergone treatment for platelets including steroid therapy, ritxumab or TPOs.  --Workup from 05/16/2022  ruled out hepatitis B/C, HIV and nutritional deficiencies. Findings are most consistent with ITP.  --Labs today show platelet count of 90K. No other cytopenias. Creatinine and LFTs normal.  --Continue with surveillance.  --RTC in 1 year with repeat labs  No orders of the defined types were placed in this encounter.   All questions were answered. The patient knows to call the clinic with any problems, questions or concerns.  I have spent a total of 25 minutes minutes of face-to-face and non-face-to-face time, preparing to see the patient, performing a medically appropriate examination, counseling and educating the patient,documenting clinical information in the electronic health record,and care coordination.   Georga Kaufmann, PA-C Department of Hematology/Oncology [facility] at [facility] Phone: [phone]

## 2023-06-27 DIAGNOSIS — D225 Melanocytic nevi of trunk: Secondary | ICD-10-CM | POA: Diagnosis not present

## 2023-06-27 DIAGNOSIS — D224 Melanocytic nevi of scalp and neck: Secondary | ICD-10-CM | POA: Diagnosis not present

## 2023-06-27 DIAGNOSIS — Z85828 Personal history of other malignant neoplasm of skin: Secondary | ICD-10-CM | POA: Diagnosis not present

## 2023-06-27 DIAGNOSIS — L821 Other seborrheic keratosis: Secondary | ICD-10-CM | POA: Diagnosis not present

## 2023-07-10 DIAGNOSIS — R509 Fever, unspecified: Secondary | ICD-10-CM | POA: Diagnosis not present

## 2023-07-10 DIAGNOSIS — J208 Acute bronchitis due to other specified organisms: Secondary | ICD-10-CM | POA: Diagnosis not present

## 2023-07-10 DIAGNOSIS — Z03818 Encounter for observation for suspected exposure to other biological agents ruled out: Secondary | ICD-10-CM | POA: Diagnosis not present

## 2023-09-28 DIAGNOSIS — M5440 Lumbago with sciatica, unspecified side: Secondary | ICD-10-CM | POA: Diagnosis not present

## 2023-09-28 DIAGNOSIS — D696 Thrombocytopenia, unspecified: Secondary | ICD-10-CM | POA: Diagnosis not present

## 2023-09-28 DIAGNOSIS — M3501 Sicca syndrome with keratoconjunctivitis: Secondary | ICD-10-CM | POA: Diagnosis not present

## 2023-09-28 DIAGNOSIS — M7989 Other specified soft tissue disorders: Secondary | ICD-10-CM | POA: Diagnosis not present

## 2023-09-28 DIAGNOSIS — R21 Rash and other nonspecific skin eruption: Secondary | ICD-10-CM | POA: Diagnosis not present

## 2023-10-28 ENCOUNTER — Emergency Department (HOSPITAL_BASED_OUTPATIENT_CLINIC_OR_DEPARTMENT_OTHER): Payer: BC Managed Care – PPO

## 2023-10-28 ENCOUNTER — Encounter (HOSPITAL_BASED_OUTPATIENT_CLINIC_OR_DEPARTMENT_OTHER): Payer: Self-pay | Admitting: Emergency Medicine

## 2023-10-28 ENCOUNTER — Emergency Department (HOSPITAL_BASED_OUTPATIENT_CLINIC_OR_DEPARTMENT_OTHER)
Admission: EM | Admit: 2023-10-28 | Discharge: 2023-10-29 | Disposition: A | Discharge status: Home / Self Care | Payer: BC Managed Care – PPO | Attending: Emergency Medicine | Admitting: Emergency Medicine

## 2023-10-28 ENCOUNTER — Other Ambulatory Visit: Payer: Self-pay

## 2023-10-28 ENCOUNTER — Emergency Department (HOSPITAL_BASED_OUTPATIENT_CLINIC_OR_DEPARTMENT_OTHER): Payer: BC Managed Care – PPO | Admitting: Radiology

## 2023-10-28 DIAGNOSIS — R42 Dizziness and giddiness: Secondary | ICD-10-CM

## 2023-10-28 DIAGNOSIS — R079 Chest pain, unspecified: Secondary | ICD-10-CM | POA: Diagnosis not present

## 2023-10-28 DIAGNOSIS — R0789 Other chest pain: Secondary | ICD-10-CM | POA: Diagnosis not present

## 2023-10-28 DIAGNOSIS — R2990 Unspecified symptoms and signs involving the nervous system: Secondary | ICD-10-CM

## 2023-10-28 DIAGNOSIS — Z20822 Contact with and (suspected) exposure to covid-19: Secondary | ICD-10-CM | POA: Insufficient documentation

## 2023-10-28 DIAGNOSIS — R29818 Other symptoms and signs involving the nervous system: Secondary | ICD-10-CM | POA: Diagnosis not present

## 2023-10-28 LAB — COMPREHENSIVE METABOLIC PANEL
ALT: 29 U/L (ref 0–44)
AST: 31 U/L (ref 15–41)
Albumin: 4.1 g/dL (ref 3.5–5.0)
Alkaline Phosphatase: 55 U/L (ref 38–126)
Anion gap: 7 (ref 5–15)
BUN: 19 mg/dL (ref 6–20)
CO2: 28 mmol/L (ref 22–32)
Calcium: 9.4 mg/dL (ref 8.9–10.3)
Chloride: 103 mmol/L (ref 98–111)
Creatinine, Ser: 0.86 mg/dL (ref 0.44–1.00)
GFR, Estimated: >60 mL/min (ref >60)
Glucose, Bld: 90 mg/dL (ref 70–99)
Potassium: 3.8 mmol/L (ref 3.5–5.1)
Sodium: 138 mmol/L (ref 135–145)
Total Bilirubin: 0.5 mg/dL (ref 0.0–1.2)
Total Protein: 8.1 g/dL (ref 6.5–8.1)

## 2023-10-28 LAB — CBC WITH DIFFERENTIAL/PLATELET
Abs Immature Granulocytes: 0.01 10*3/uL (ref 0.00–0.07)
Basophils Absolute: 0.1 10*3/uL (ref 0.0–0.1)
Basophils Relative: 1 %
Eosinophils Absolute: 0.1 10*3/uL (ref 0.0–0.5)
Eosinophils Relative: 3 %
HCT: 39.6 % (ref 36.0–46.0)
Hemoglobin: 13 g/dL (ref 12.0–15.0)
Immature Granulocytes: 0 %
Lymphocytes Relative: 23 %
Lymphs Abs: 1.2 10*3/uL (ref 0.7–4.0)
MCH: 29.7 pg (ref 26.0–34.0)
MCHC: 32.8 g/dL (ref 30.0–36.0)
MCV: 90.6 fL (ref 80.0–100.0)
Monocytes Absolute: 0.4 10*3/uL (ref 0.1–1.0)
Monocytes Relative: 8 %
Neutro Abs: 3.3 10*3/uL (ref 1.7–7.7)
Neutrophils Relative %: 65 %
Platelets: 88 10*3/uL — ABNORMAL LOW (ref 150–400)
RBC: 4.37 MIL/uL (ref 3.87–5.11)
RDW: 12.9 % (ref 11.5–15.5)
WBC: 5.1 10*3/uL (ref 4.0–10.5)
nRBC: 0 % (ref 0.0–0.2)

## 2023-10-28 LAB — RESP PANEL BY RT-PCR (RSV, FLU A&B, COVID)  RVPGX2
Influenza A by PCR: NEGATIVE
Influenza B by PCR: NEGATIVE
Resp Syncytial Virus by PCR: NEGATIVE
SARS Coronavirus 2 by RT PCR: NEGATIVE

## 2023-10-28 LAB — TROPONIN I (HIGH SENSITIVITY)
Troponin I (High Sensitivity): 2 ng/L (ref <18)
Troponin I (High Sensitivity): 3 ng/L (ref <18)

## 2023-10-28 LAB — PREGNANCY, URINE: Preg Test, Ur: NEGATIVE

## 2023-10-28 MED ORDER — IOHEXOL 350 MG/ML SOLN
100.0000 mL | Freq: Once | INTRAVENOUS | Status: AC | PRN
  Administered 2023-10-28: 75 mL via INTRAVENOUS

## 2023-10-28 NOTE — ED Triage Notes (Signed)
 Patient presents with intermittent chest pain, "numbness all over", faint feeling and right eye heaviness.  Patient reports hx "auto immune" disease and thinks sx related to same.

## 2023-10-28 NOTE — ED Provider Notes (Signed)
 [place] EMERGENCY DEPARTMENT AT [facility] Provider Note   CSN: [id number] Arrival date & time: 10/28/23  2003     History  Chief Complaint  Patient presents with   Chest Pain   Numbness    Sarah Garza is a 53 y.o. female.  HPI      Feeling in head, like pressure, then right eye will feel heavy, and face will feel pressure like something in head.  Right eyelid would droop and stay down, and would last about an hour. All of this has happened before but because hx of autoimmune stuff didn't think much of it. Today lasted longer and felt like going to pass out and decided to come in.   When it happens, feels like can't communicate, lightheaded, word finding, tingling all over, hot and cold.  6PM   Lately feeling like mind is not clear or right, thinking autoimmune, perimenopausal. How felt today is how felt a lot of days.   Was trying to say-I feel like I need to pass out but don't have the words  Has woken up a few times a night, like out of body experience, wakes her up, but feels like going to pass out.  Has chest pressure, weird tingling, not a headache but head pressure,  feels like body changing like something in there pushing.  Right eye lid will be heavier.    Today is the first day has had it with someone present. Feeling like head is not the same.  Happened twice at night, this is the second time it has happened this week, in total 8 times this has happened over the last 2-3 months.  Usually lasts 2-5 minutes. Today felt more like 15-77minutes.   Felt after it happened wiped out, tired.   Has cardiologist because chest pain before, low bp, has low bp.  Works for [employer], homeless shelter, after getting covid the 6th time was sick and since then has had strange things happen and sort of blows it off.  Cardiologist in the end thought maybe from viral thing, bp and glucose more reasonable in past year but took good 2 years to feel better.  On midodrine  is like  90/65, did take it today.  Without it would be in 80s.  2-3 years like that.    Typically the episodes do not involve chest pain but did today, has had 2 other episodes with chest pain.  Chest pain feels like tightness and not a pain, more heavy, tight.  Today arm hurt.  Pain to back today too.  Nausea, shortness of breath.  Had 2 loose stool not diarrhea. Some left side pain and also some epigastric pain. Tingling, face, head, like sensation going to pass out.      Past Medical History:  Diagnosis Date   Arterial hypotension 10/17/2021   ITP (idiopathic thrombocytopenic purpura)    Low back pain 02/15/2016   Rheumatoid aortitis    Sjoegren syndrome    Syncope    Likely related to hypotension    Home Medications Prior to Admission medications   Medication Sig Start Date End Date Taking? Authorizing Provider  BIOTIN PO Take by mouth daily.    [provider]  Calcium Carbonate (CALCIUM 500 PO) Take by mouth daily.    [provider]  hydroxychloroquine (PLAQUENIL) 200 MG tablet Take 200 mg by mouth daily.    [provider]  midodrine  (PROAMATINE ) 2.5 MG tablet Take 1 tablet (2.5 mg total) by mouth  in the morning and at bedtime. May take an additional 2.5 mg in the evening if needed daily 03/16/23   Anner Alm ORN, MD  Misc Natural Products (OSTEO BI-FLEX ADV JOINT SHIELD PO) Take by mouth. With glucosamine and chrondroitin    [provider]  Multiple Vitamin (MULTIVITAMIN) tablet Take 1 tablet by mouth daily.    [provider]  naproxen sodium (ALEVE) 220 MG tablet Take 220-440 mg by mouth daily as needed (cramps).    [provider]  Propylene Glycol (SYSTANE BALANCE OP) Apply to eye.    [provider]  Vitamin D, Ergocalciferol, (DRISDOL) 1.25 MG (50000 UNIT) CAPS capsule Take 50,000 Units by mouth every Friday. 03/25/20   [provider]      Allergies    Cefdinir, Doxycycline hyclate, Gluten meal, Levaquin  [levofloxacin], Mucinex [guaifenesin er], Penicillins, and Robaxin  [methocarbamol ]    Review of Systems   Review of Systems  Physical Exam Updated Vital Signs BP 95/64   Pulse 67   Temp 98.3 F (36.8 C) (Oral)   Resp 16   Ht 5' 4 (1.626 m)   Wt 54.4 kg   SpO2 98%   BMI 20.60 kg/m  Physical Exam Constitutional:      General: She is not in acute distress.    Appearance: Normal appearance. She is not ill-appearing.  HENT:     Head: Normocephalic and atraumatic.  Eyes:     General: No visual field deficit.    Extraocular Movements: Extraocular movements intact.     Conjunctiva/sclera: Conjunctivae normal.     Pupils: Pupils are equal, round, and reactive to light.  Cardiovascular:     Rate and Rhythm: Normal rate and regular rhythm.     Pulses: Normal pulses.  Pulmonary:     Effort: Pulmonary effort is normal. No respiratory distress.  Musculoskeletal:        General: No swelling or tenderness.     Cervical back: Normal range of motion.  Skin:    General: Skin is warm and dry.     Findings: No erythema or rash.  Neurological:     General: No focal deficit present.     Mental Status: She is alert and oriented to person, place, and time.     GCS: GCS eye subscore is 4. GCS verbal subscore is 5. GCS motor subscore is 6.     Cranial Nerves: No cranial nerve deficit, dysarthria or facial asymmetry.     Sensory: No sensory deficit.     Motor: No weakness or tremor.     Coordination: Coordination normal. Finger-Nose-Finger Test normal.     Gait: Gait normal.     ED Results / Procedures / Treatments   Labs (all labs ordered are listed, but only abnormal results are displayed) Labs Reviewed  CBC WITH DIFFERENTIAL/PLATELET - Abnormal; Notable for the following components:      Result Value   Platelets 88 (*)    All other components within normal limits  RESP PANEL BY RT-PCR (RSV, FLU A&B, COVID)  [id number]  COMPREHENSIVE METABOLIC PANEL  PREGNANCY, URINE  TROPONIN I  (HIGH SENSITIVITY)  TROPONIN I (HIGH SENSITIVITY)    EKG EKG Interpretation Date/Time:  Sunday October 28 2023 20:18:02 EST Ventricular Rate:  82 PR Interval:  155 QRS Duration:  94 QT Interval:  367 QTC Calculation: 429 R Axis:   74  Text Interpretation: Sinus rhythm Low voltage, precordial leads No significant change since last tracing Confirmed by Dreama Longs ([id number])  on 10/28/2023 8:41:19 PM  Radiology CT ANGIO HEAD NECK W WO CM Result Date: 10/29/2023 CLINICAL DATA:  Initial evaluation for acute neuro deficit, stroke suspected. EXAM: CT ANGIOGRAPHY HEAD AND NECK WITH AND WITHOUT CONTRAST TECHNIQUE: Multidetector CT imaging of the head and neck was performed using the standard protocol during bolus administration of intravenous contrast. Multiplanar CT image reconstructions and MIPs were obtained to evaluate the vascular anatomy. Carotid stenosis measurements (when applicable) are obtained utilizing NASCET criteria, using the distal internal carotid diameter as the denominator. RADIATION DOSE REDUCTION: This exam was performed according to the departmental dose-optimization program which includes automated exposure control, adjustment of the mA and/or kV according to patient size and/or use of iterative reconstruction technique. CONTRAST:  75mL OMNIPAQUE  IOHEXOL  350 MG/ML SOLN COMPARISON:  Prior study from 03/15/2021. FINDINGS: CT HEAD FINDINGS Brain: Cerebral volume within normal limits. 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 No acute intracranial hemorrhage. No acute large vessel territory infarct. No mass lesion or midline shift. No hydrocephalus or extra-axial fluid collection. Vascular: No abnormal hyperdense vessel. Skull: Scalp soft tissues within normal limits.  Calvarium intact. Sinuses/Orbits: Globes and orbital soft tissues within normal limits. Mild scattered mucosal thickening noted about the ethmoidal  air cells and maxillary sinuses. Mastoid air cells are clear. Other: None. Review of the MIP images confirms the above findings CTA NECK FINDINGS Aortic arch: Standard branching. Imaged portion shows no evidence of aneurysm or dissection. No significant stenosis of the major arch vessel origins. Right carotid system: No evidence of dissection, stenosis (50% or greater), or occlusion. Left carotid system: No evidence of dissection, stenosis (50% or greater), or occlusion. Vertebral arteries: No evidence of dissection, stenosis (50% or greater), or occlusion. Skeleton: No discrete or worrisome osseous lesions. Moderate spondylosis present at C6-7. Other neck: No other acute finding. Chronic atrophy involving the parotid and submandibular glands noted bilaterally. Upper chest: No other acute finding. Review of the MIP images confirms the above findings CTA HEAD FINDINGS Anterior circulation: Both internal carotid arteries are patent through the siphons without stenosis or other abnormality. Note made of the right ophthalmic artery arising from the inferior aspect of the cavernous right ICA. A1 segments patent bilaterally. Normal anterior communicating artery complex. Anterior cerebral arteries patent without stenosis. No M1 stenosis or occlusion. Distal MCA branches perfused and symmetric. Posterior circulation: Both V4 segments patent without stenosis. Both PICA patent. Basilar patent without stenosis. Superior cerebellar and posterior cerebral arteries patent bilaterally. Venous sinuses: Patent allowing for timing the contrast bolus. Torcula inversion noted. Anatomic variants: None significant.  No aneurysm. Review of the MIP images confirms the above findings IMPRESSION: CT HEAD: 1. No acute intracranial abnormality. 2. 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 CTA HEAD AND NECK: Negative CTA of the head and neck. No large vessel occlusion,  hemodynamically significant stenosis, or other acute vascular abnormality. No intracranial aneurysm. Electronically Signed   By: Morene Hoard M.D.   On: 10/29/2023 00:56   DG Chest 2 View Result Date: 10/28/2023 CLINICAL DATA:  Intermittent chest pain. EXAM: CHEST - 2 VIEW COMPARISON:  October 02, 2021 FINDINGS: The heart size and mediastinal contours are within normal limits. Both lungs are clear. The visualized skeletal structures are unremarkable. IMPRESSION: No active cardiopulmonary disease. Electronically Signed   By: Suzen Dials M.D.   On: 10/28/2023 21:22    Procedures Procedures    Medications Ordered  in ED Medications  iohexol  (OMNIPAQUE ) 350 MG/ML injection 100 mL (75 mLs Intravenous Contrast Given 10/28/23 2339)    ED Course/ Medical Decision Making/ A&P                                  53 year old female with a history of Sjogren's, chronic ITP, idiopathic hypotension for which she sees cardiology and takes midodrine  who presents with concern for episodes of head pressure, right eyelid drooping, lightheadedness which have occurred lasting a few minutes each time over the last couple months with episode today lasting 15 minutes also with associated chest pressure.  Regarding chest pain: Differential diagnosis includes ACS, myocarditis, pericarditis, PE, aortic dissection, intra-abdominal etiologies.    EKG completed and personally evaluated interpreted by me shows no acute ST changes.  Chest x-ray completed and personally read by me and radiology shows no evidence of active cardiopulmonary disease.  Troponin is negative and have low suspicion for ACS or myocarditis.  EKG is not consistent with pericarditis.  Given recurrent nature of chest pain which is nonexertional, normal pulses bilateral upper and lower extremities, normal chest x-ray, no continued shortness of breath or other symptoms have low suspicion at this time for PE or aortic dissection.  Regarding  neurologic symptoms: DDx includes TIA, focal seizures, electrolyte abnormalities, other etiologies of lightheadedness leading to other neurologic symptoms, ICH. Labs completed and evaluated by me without anemia, leukocytosis, electrolyte abnormalities, no transaminitis, negative pregnancy test, covid/rsv/influenza negative.  CTA head/neck ordered to evaluate for significant stenosis as etiology of transient neurologic symptoms is ordered and pending.  Consider focal seizures given symptoms lasting a few minutes and followed by fatigue. Given 8 similar episodes over last few months, currently normal neurologic exam, do feel if no clinically significant abnormality/stenosis on CTA she can follow up as outpatient with neurology.  Signed out with CTA pending.          Final Clinical Impression(s) / ED Diagnoses Final diagnoses:  Lightheadedness  Chest pain, unspecified type  Episode of transient neurologic symptoms    Rx / DC Orders ED Discharge Orders          Ordered    Ambulatory referral to Neurology       Comments: An appointment is requested in approximately: 2 weeks   10/28/23 2356              Dreama Longs, MD 10/29/23 [phone]

## 2023-10-28 NOTE — Discharge Instructions (Addendum)
 Follow-up with neurology in the near future.  A referral has been placed and the office should contact you to make these arrangements.  If you have not heard from them in the next few days, the contact information for [facility] neurology has been provided in this discharge summary for you to call and make these arrangements.

## 2023-10-29 NOTE — ED Provider Notes (Signed)
  Physical Exam  BP (!) 98/59   Pulse 70   Temp 98.3 F (36.8 C) (Oral)   Resp 16   Ht 5\' 4"  (1.626 m)   Wt 54.4 kg   SpO2 99%   BMI 20.60 kg/m   Physical Exam Vitals reviewed.  Pulmonary:     Effort: Pulmonary effort is normal.  Skin:    General: Skin is warm and dry.  Neurological:     Mental Status: She is alert.     Procedures  Procedures  ED Course / MDM    Medical Decision Making Amount and/or Complexity of Data Reviewed Labs: ordered. Radiology: ordered.  Risk Prescription drug management.   Care assumed from Dr. Tamela Fake at shift change.  Patient awaiting results of CTA of the head and neck.  This study has returned and is basically unremarkable.  Patient describes episodes of numbness and near syncope that have been occurring for several months, but now on greater frequency.  She is neurologically intact and asymptomatic upon my reassessment.  I feel as though patient can safely be discharged.  I will have her follow-up with neurology.       Sarah Blander, MD 10/29/23 [phone]

## 2023-12-27 DIAGNOSIS — N951 Menopausal and female climacteric states: Secondary | ICD-10-CM | POA: Diagnosis not present

## 2023-12-27 DIAGNOSIS — R300 Dysuria: Secondary | ICD-10-CM | POA: Diagnosis not present

## 2024-01-03 DIAGNOSIS — Z1231 Encounter for screening mammogram for malignant neoplasm of breast: Secondary | ICD-10-CM | POA: Diagnosis not present

## 2024-01-16 ENCOUNTER — Ambulatory Visit (INDEPENDENT_AMBULATORY_CARE_PROVIDER_SITE_OTHER): Payer: BC Managed Care – PPO

## 2024-02-15 DIAGNOSIS — H04123 Dry eye syndrome of bilateral lacrimal glands: Secondary | ICD-10-CM | POA: Diagnosis not present

## 2024-02-20 ENCOUNTER — Encounter: Payer: Self-pay | Admitting: Cardiology

## 2024-02-27 DIAGNOSIS — N951 Menopausal and female climacteric states: Secondary | ICD-10-CM | POA: Diagnosis not present

## 2024-02-27 DIAGNOSIS — M069 Rheumatoid arthritis, unspecified: Secondary | ICD-10-CM | POA: Diagnosis not present

## 2024-02-27 DIAGNOSIS — I951 Orthostatic hypotension: Secondary | ICD-10-CM | POA: Diagnosis not present

## 2024-03-28 DIAGNOSIS — D696 Thrombocytopenia, unspecified: Secondary | ICD-10-CM | POA: Diagnosis not present

## 2024-03-28 DIAGNOSIS — M3501 Sicca syndrome with keratoconjunctivitis: Secondary | ICD-10-CM | POA: Diagnosis not present

## 2024-03-28 DIAGNOSIS — E559 Vitamin D deficiency, unspecified: Secondary | ICD-10-CM | POA: Diagnosis not present

## 2024-03-28 DIAGNOSIS — M5440 Lumbago with sciatica, unspecified side: Secondary | ICD-10-CM | POA: Diagnosis not present

## 2024-04-29 DIAGNOSIS — Z124 Encounter for screening for malignant neoplasm of cervix: Secondary | ICD-10-CM | POA: Diagnosis not present

## 2024-04-29 DIAGNOSIS — E559 Vitamin D deficiency, unspecified: Secondary | ICD-10-CM | POA: Diagnosis not present

## 2024-04-29 DIAGNOSIS — I951 Orthostatic hypotension: Secondary | ICD-10-CM | POA: Diagnosis not present

## 2024-04-29 DIAGNOSIS — Z0000 Encounter for general adult medical examination without abnormal findings: Secondary | ICD-10-CM | POA: Diagnosis not present

## 2024-04-29 DIAGNOSIS — M069 Rheumatoid arthritis, unspecified: Secondary | ICD-10-CM | POA: Diagnosis not present

## 2024-05-05 ENCOUNTER — Encounter: Payer: Self-pay | Admitting: Cardiology

## 2024-05-05 ENCOUNTER — Ambulatory Visit: Attending: Cardiology | Admitting: Cardiology

## 2024-05-05 VITALS — BP 95/63 | HR 76 | Ht 64.0 in | Wt 131.3 lb

## 2024-05-05 DIAGNOSIS — I950 Idiopathic hypotension: Secondary | ICD-10-CM | POA: Diagnosis not present

## 2024-05-05 DIAGNOSIS — R002 Palpitations: Secondary | ICD-10-CM

## 2024-05-05 DIAGNOSIS — R600 Localized edema: Secondary | ICD-10-CM

## 2024-05-05 DIAGNOSIS — R011 Cardiac murmur, unspecified: Secondary | ICD-10-CM | POA: Diagnosis not present

## 2024-05-05 NOTE — Progress Notes (Signed)
 Cardiology Office Note:  .   Date:  05/10/2024  ID:  Sarah Garza, DOB Feb 28, 1971, MRN [id number] PCP: Cleotilde Planas, MD  [facility] Providers Cardiologist:  Alm Clay, MD Cardiology APP:  Madie Jon Garre, [name]     Chief Complaint  Patient presents with   Follow-up    Blood pressures have been more stable.  Less dizzy.  Lots of other symptoms discussed.    Patient Profile: Sarah     Sarah Garza is a 53 y.o. female  with a PMH notable for Sjogren's Sicca with hypotension and palpitations who presents here for annual follow-up at the request of Cleotilde Planas, MD.  I saw her initially in April 2023 for low blood pressures and dizziness.  She was started on midodrine , told to wear support stockings and liberalize salt intake.  She saw Josefa Beauvais in August 2023 still with weakness in the leg and dizziness.  Was drinking up to 100 ounces of fluid a day as well as intermittent liquid IV drinks.  Midodrine  was increased to twice a day.  I then saw her (my last visit with her) in October 2023.  She said that her dizziness was doing better.  She was having issues with cerumen impaction leading to middle ear issues.  She also noted irregular heartbeats and sharp pinching sensation in her chest lasting up to 15-20 minutes.  Her legs are still aching and cramping, indicating that wearing support stockings were uncomfortable.  She was doing her best to elevate her feet when possible.  She noted fatigue and malaise. => We evaluated with a Zio patch monitor (see below) that was relatively normal.  No sustained arrhythmias.  PACs and PVCs were rare and 3 brief atrial runs but otherwise pretty normal study.     Sarah Garza was last seen by Josefa Beauvais, NP in July 2024 as a 57-month follow-up.  She was still having some chest discomfort and a generalized bloating sensation.  Evaluated by neurology with no clear indication what the issue was.  She had been on Plaquenil for her  Sjogren's related arthritis and was unable to wean off due to joint pain.  She still noted below the knee tingling and numbness.  But was pretty stable from a dizziness and lightheadedness standpoint.  Plan was for year follow-up.  Subjective  Discussed the use of AI scribe software for clinical note transcription with the patient, who gave verbal consent to proceed.  History of Present Illness History of Present Illness Sarah Garza is a 53 year old female with Sjogren's syndrome and palpitations who presents for follow-up regarding leg swelling and neurological symptoms.  She experiences chronic leg swelling, which she manages by avoiding prolonged standing and walking. Relief is found using pedal exercisers to keep her muscles active. She experiences numbness and difficulty moving her legs if she sleeps or moves incorrectly. She has not followed up with neurology and has been self-monitoring her symptoms.  She has a history of palpitations that occur occasionally while sitting and resolve spontaneously. She takes her blood pressure medication twice daily and an additional dose if needed, noting that her blood pressure can vary daily.  She has Sjogren's syndrome and takes Plaquenil for management. She experiences intense dryness, particularly in her mouth, which she manages by drinking water and chewing gum. No swallowing issues are reported.  In February, she visited the emergency room due to chest pain and symptoms resembling a heart attack, including pain from  her neck down to her arms. 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   She experiences burning sensations in her feet when sitting, which improve with walking. She has gained weight recently, which she attributes to perimenopause, and is content with the weight  gain as it makes her feel more robust.   Cardiovascular ROS: no chest pain or dyspnea on exertion positive for - edema and palpitations negative for - orthopnea, paroxysmal nocturnal dyspnea, rapid heart rate, shortness of breath, or true syncope or near syncope, TIA or amaurosis fugax, claudication.     Objective   Medications: Plaquenil 200 M daily; midodrine  2.5 mg twice daily with additional dose PRN for dizziness or low blood pressure. - Povidone eyedrops 6 times daily, Systane eyedrops. - PRN Naprosyn. - Supplements: Biotin, calcium, multivitamin and vitamin D  Studies Reviewed: .        ~ 14 d ZIO PATCH MONITOR: 10/30 - 07/31/2022 -   Predominant underlying rhythm: Sinus Rhythm (1  AVB): HR range 47-136 bpm, avg 72 bpm;   Rare (<1%) PACs and PVCs.  No PVC bigeminy/trigeminy. ;   3 Atrial Runs: Fastest 4 beats w/ HR Range 124-133 bpm, Avg 129 bpm, 1.9 sec; Longest 7 beats w/ HR Range 102-115 bpm, Avg 109 bpm, 3.9 sec    No Sustained Arrhythmias: Atrial Tachycardia (AT), Supraventricular Tachycardia (SVT), Atrial Fibrillation (A-Fib), Atrial Flutter (A-Flutter), Sustained Ventricular Tachycardia (VT)    Symptoms are noted mostly with sinus rhythm.  Occasional fluttering noted with sinus rhythm and PVC as well as the 3 short atrial runs..  Echocardiogram: Normal EF of 55 to 60%.  No RWMA.  Normal RV size and function with normal RAP and RVP.  Normal valves.  Nothing to explain murmur.  (10/17/2021)   CT HEAD: (10/2023) 1. No acute intracranial abnormality. 2. 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   CTA HEAD AND NECK: Negative CTA of the head and neck. No large vessel occlusion, hemodynamically significant stenosis, or other acute vascular abnormality. No intracranial aneurysm.  Risk Assessment/Calculations:          Physical Exam:   VS:  BP 95/63   Pulse 76   Ht 5' 4 (1.626 m)   Wt 131 lb 4.8  oz (59.6 kg)   SpO2 97%   BMI 22.54 kg/m    Wt Readings from Last 3 Encounters:  05/05/24 131 lb 4.8 oz (59.6 kg)  10/28/23 120 lb (54.4 kg)  06/01/23 121 lb (54.9 kg)    GEN: Well nourished, well developed in no acute distress; healthy appearing NECK: No JVD; No carotid bruits CARDIAC: Normal S1, S2; RRR, no murmurs, rubs, gallops RESPIRATORY:  Clear to auscultation without rales, wheezing or rhonchi ; nonlabored, good air movement. ABDOMEN: Soft, non-tender, non-distended EXTREMITIES:  No edema; No deformity      ASSESSMENT AND PLAN: .    Problem List Items Addressed This Visit       Cardiology Problems   Arterial hypotension - Primary (Chronic)   BP remains low, but relatively well-controlled with the midodrine  2.5 mg twice daily.  She does have to intermittently use additional dose. She is doing well to maintain adequate hydration with significant fluid hydration, liquid IV and liberalization of salt intake. We discussed supine/recumbent exercises and  avoiding position changes. Avoiding caffeine and sweets.  Wear support stockings as tolerated.        Other   Bilateral lower extremity edema (Chronic)   Swelling managed with activity, pedal exercises, and support stockings. Possible disc issue causing leg numbness and weakness. - Continue use of support stockings.      Cardiac murmur (Chronic)   No notable murmur on exam.  Echocardiogram did not show any indication for why she would have a murmur.  I suspect that murmur could be heard in the setting of dehydration or anemia which could explain the murmur heard during PCP exam.      Palpitations (Chronic)   Zio patch monitor relatively reassuring.  She still has palpitations but not overly symptomatic.  I suspect this probably this probably has to do with electrolyte changes              Follow-Up: Return in about 1 year (around 05/05/2025).     Signed, Alm MICAEL Clay, MD, MS Alm Clay, M.D.,  M.S. Interventional Chartered certified accountant  Pager # [phone]

## 2024-05-05 NOTE — Patient Instructions (Signed)
 Medication Instructions:  No changes   *If you need a refill on your cardiac medications before your next appointment, please call your pharmacy*   Lab Work: Not needed    Testing/Procedures:  Not needed  Follow-Up: At [facility], you and your health needs are our priority.  As part of our continuing mission to provide you with exceptional heart care, we have created designated Provider Care Teams.  These Care Teams include your primary Cardiologist (physician) and Advanced Practice Providers (APPs -  Physician Assistants and Nurse Practitioners) who all work together to provide you with the care you need, when you need it.     Your next appointment:   12 month(s)  The format for your next appointment:   In Person  Provider:   Jon Hails, PA-C      Then, Alm Clay, MD will plan to see you again in  24  month(s).

## 2024-05-10 ENCOUNTER — Encounter: Payer: Self-pay | Admitting: Cardiology

## 2024-05-10 NOTE — Assessment & Plan Note (Signed)
 No notable murmur on exam.  Echocardiogram did not show any indication for why she would have a murmur.  I suspect that murmur could be heard in the setting of dehydration or anemia which could explain the murmur heard during PCP exam.

## 2024-05-10 NOTE — Assessment & Plan Note (Signed)
 BP remains low, but relatively well-controlled with the midodrine  2.5 mg twice daily.  She does have to intermittently use additional dose. She is doing well to maintain adequate hydration with significant fluid hydration, liquid IV and liberalization of salt intake. We discussed supine/recumbent exercises and avoiding position changes. Avoiding caffeine and sweets.  Wear support stockings as tolerated.

## 2024-05-10 NOTE — Assessment & Plan Note (Signed)
 Swelling managed with activity, pedal exercises, and support stockings. Possible disc issue causing leg numbness and weakness. - Continue use of support stockings.

## 2024-05-10 NOTE — Assessment & Plan Note (Signed)
 Zio patch monitor relatively reassuring.  She still has palpitations but not overly symptomatic.  I suspect this probably this probably has to do with electrolyte changes

## 2024-06-05 ENCOUNTER — Other Ambulatory Visit: Payer: Self-pay | Admitting: Physician Assistant

## 2024-06-05 DIAGNOSIS — D693 Immune thrombocytopenic purpura: Secondary | ICD-10-CM

## 2024-06-05 NOTE — Progress Notes (Unsigned)
 [facility] [phone]   [phone]  PROGRESS NOTE  Patient Care Team: Cleotilde Planas, MD as PCP - General (Family Medicine) Anner Alm ORN, MD as PCP - Cardiology (Cardiology) Mai Lynwood FALCON, MD as Consulting Physician (Rheumatology) Duke, Jon Garre, PA as Physician Assistant (Cardiology)   CHIEF COMPLAINTS/PURPOSE OF CONSULTATION:  ITP  HISTORY OF PRESENTING ILLNESS:  Sarah Garza 53 y.o. female returns for a follow up for management of ITP. She  is unaccompanied for this visit.   Ms. Devlin reports she is doing well without any new or concerning symptoms.  She denies easy bruising or signs of active bleeding.  Patient reports that she is going through perimenopause with a lot of associated symptoms.  She denies nausea, vomiting, changes.  Patient denies fevers, chills, night sweats, shortness of breath, chest pain or cough.  She has no other complaints.  Rest of the 10 point ROS as below.  MEDICAL HISTORY:  Past Medical History:  Diagnosis Date   Arterial hypotension 10/17/2021   ITP (idiopathic thrombocytopenic purpura)    Low back pain 02/15/2016   Rheumatoid aortitis    Sjoegren syndrome    Syncope    Likely related to hypotension    SURGICAL HISTORY: Past Surgical History:  Procedure Laterality Date   FOOT SURGERY     HERNIA REPAIR     umbilical   TRANSTHORACIC ECHOCARDIOGRAM  10/17/2021   EF 55 to 60%.  Normal LV function with no RWMA.  Normal diastolic parameters.  Normal RV with RVP.  Normal aortic and mitral valves.  Normal RAP.    SOCIAL HISTORY: Social History   Socioeconomic History   Marital status: Single    Spouse name: Not on file   Number of children: 0   Years of education: 16   Highest education level: Not on file  Occupational History   Not on file  Tobacco Use   Smoking status: Never   Smokeless tobacco: Never  Vaping Use   Vaping status: Never Used  Substance and Sexual Activity    Alcohol use: No   Drug use: No   Sexual activity: Not on file  Other Topics Concern   Not on file  Social History Narrative   Lives at home w/ her mother   Right-handed   Drinks 1 cup of coffee daily   Social Drivers of Health   Financial Resource Strain: Low Risk  (02/13/2022)   Received from [facility]   Overall Financial Resource Strain (CARDIA)    Difficulty of Paying Living Expenses: Not hard at all  Food Insecurity: No Food Insecurity (02/13/2022)   Received from [facility]   Hunger Vital Sign    Within the past 12 months, you worried that your food would run out before you got the money to buy more.: Never true    Within the past 12 months, the food you bought just didn't last and you didn't have money to get more.: Never true  Transportation Needs: No Transportation Needs (02/13/2022)   Received from [facility] - Transportation    In the past 12 months, has lack of transportation kept you from medical appointments or from getting medications?: No    Lack of Transportation (Non-Medical): No  Physical Activity: Sufficiently Active (03/30/2021)   Received from [facility]   Exercise Vital Sign    On average, how many days per week do you engage in moderate  to strenuous exercise (like a brisk walk)?: 5 days    On average, how many minutes do you engage in exercise at this level?: 40 min  Stress: Stress Concern Present (03/30/2021)   Received from [facility] - Occupational Stress Questionnaire    Feeling of Stress : To some extent  Social Connections: Not on file  Intimate Partner Violence: Not on file    FAMILY HISTORY: Family History  Problem Relation Age of Onset   Hypothyroidism Mother    COPD Father    Cirrhosis Father    Valvular heart disease Father        S/p MVR   Sjogren's syndrome Sister    Rheum arthritis Sister     Lung cancer Maternal Grandmother    Heart attack Maternal Grandfather    CAD Maternal Grandfather     ALLERGIES:  is allergic to cefdinir, doxycycline hyclate, gluten meal, levaquin [levofloxacin], mucinex [guaifenesin er], penicillins, and robaxin  [methocarbamol ].  MEDICATIONS:  Current Outpatient Medications  Medication Sig Dispense Refill   BIOTIN PO Take by mouth daily.     Calcium Carbonate (CALCIUM 500 PO) Take by mouth daily.     ELDERBERRY PO Take by mouth.     hydroxychloroquine (PLAQUENIL) 200 MG tablet Take 200 mg by mouth daily.     midodrine  (PROAMATINE ) 2.5 MG tablet Take 1 tablet (2.5 mg total) by mouth in the morning and at bedtime. May take an additional 2.5 mg in the evening if needed daily 75 tablet 6   Misc Natural Products (OSTEO BI-FLEX ADV JOINT SHIELD PO) Take by mouth. With glucosamine and chrondroitin     Multiple Vitamin (MULTIVITAMIN) tablet Take 1 tablet by mouth daily.     naproxen sodium (ALEVE) 220 MG tablet Take 220-440 mg by mouth daily as needed (cramps).     Povidone, PF, (IVIZIA DRY EYES) 0.5 % SOLN Apply 1 drop to eye 6 (six) times daily.     Propylene Glycol (SYSTANE BALANCE OP) Apply to eye.     Vitamin D, Ergocalciferol, (DRISDOL) 1.25 MG (50000 UNIT) CAPS capsule Take 50,000 Units by mouth every Friday.     No current facility-administered medications for this visit.    REVIEW OF SYSTEMS:   Constitutional: ( - ) fevers, ( - )  chills , ( - ) night sweats Eyes: ( - ) blurriness of vision, ( - ) double vision, ( - ) watery eyes Ears, nose, mouth, throat, and face: ( - ) mucositis, ( - ) sore throat Respiratory: ( - ) cough, ( - ) dyspnea, ( - ) wheezes Cardiovascular: ( - ) palpitation, ( - ) chest discomfort, ( - ) lower extremity swelling Gastrointestinal:  ( - ) nausea, ( - ) heartburn, ( - ) change in bowel habits Skin: ( - ) abnormal skin rashes Lymphatics: ( - ) new lymphadenopathy, ( - ) easy bruising Neurological: ( - ) numbness, (  - ) tingling, ( - ) new weaknesses Behavioral/Psych: ( - ) mood change, ( - ) new changes  All other systems were reviewed with the patient and are negative.  PHYSICAL EXAMINATION: ECOG PERFORMANCE STATUS: 0 - Asymptomatic  Vitals:   06/06/24 1048  BP: 107/61  Pulse: 64  Resp: 16  Temp: (!) 97.5 F (36.4 C)  SpO2: 99%    Filed Weights   06/06/24 1048  Weight: 131 lb (59.4 kg)     GENERAL: well appearing female in NAD  SKIN: skin color, texture, turgor are normal, no rashes or significant lesions EYES: conjunctiva are pink and non-injected, sclera clear LUNGS: clear to auscultation and percussion with normal breathing effort HEART: regular rate & rhythm and no murmurs and no lower extremity edema Musculoskeletal: no cyanosis of digits and no clubbing  PSYCH: alert & oriented x 3, fluent speech NEURO: no focal motor/sensory deficits  LABORATORY DATA:  I have reviewed the data as listed    Latest Ref Rng & Units 06/06/2024   10:08 AM 10/28/2023    9:01 PM 06/01/2023   11:05 AM  CBC  WBC 4.0 - 10.5 K/uL 5.0  5.1  5.3   Hemoglobin 12.0 - 15.0 g/dL 87.4  86.9  86.9   Hematocrit 36.0 - 46.0 % 38.0  39.6  40.1   Platelets 150 - 400 K/uL 73  88  90        Latest Ref Rng & Units 06/06/2024   10:08 AM 10/28/2023    9:01 PM 06/01/2023   11:05 AM  CMP  Glucose 70 - 99 mg/dL 73  90  79   BUN 6 - 20 mg/dL 23  19  19    Creatinine 0.44 - 1.00 mg/dL 9.05  9.13  9.09   Sodium 135 - 145 mmol/L 141  138  139   Potassium 3.5 - 5.1 mmol/L 4.9  3.8  4.4   Chloride 98 - 111 mmol/L 109  103  107   CO2 22 - 32 mmol/L 29  28  29    Calcium 8.9 - 10.3 mg/dL 9.0  9.4  9.2   Total Protein 6.5 - 8.1 g/dL 7.6  8.1  7.7   Total Bilirubin 0.0 - 1.2 mg/dL 0.7  0.5  0.7   Alkaline Phos 38 - 126 U/L 49  55  49   AST 15 - 41 U/L 27  31  25    ALT 0 - 44 U/L 27  29  24     ASSESSMENT & PLAN HADYN AZER is a 53 y.o. female returns for a follow up for ITP.   #ITP, chronic: --Patient has not  undergone treatment for platelets including steroid therapy, ritxumab or TPOs.  --Workup from 05/16/2022 ruled out hepatitis B/C, HIV and nutritional deficiencies. Findings are most consistent with ITP.  --Labs today show platelet count of 73K, overall stable.  --Continue with surveillance. Patient will continue to monitor her labs with PCP. She was advised to follow up with hematology if her platelet count <50K or she develops any bleeding.   No orders of the defined types were placed in this encounter.   All questions were answered. The patient knows to call the clinic with any problems, questions or concerns.  I have spent a total of 25 minutes minutes of face-to-face and non-face-to-face time, preparing to see the patient, performing a medically appropriate examination, counseling and educating the patient,documenting clinical information in the electronic health record,and care coordination.   Johnston Police, PA-C Department of Hematology/Oncology [facility] Phone: [phone]

## 2024-06-06 ENCOUNTER — Inpatient Hospital Stay: Payer: BC Managed Care – PPO | Admitting: Physician Assistant

## 2024-06-06 ENCOUNTER — Inpatient Hospital Stay: Payer: BC Managed Care – PPO | Attending: Physician Assistant

## 2024-06-06 VITALS — BP 107/61 | HR 64 | Temp 97.5°F | Resp 16 | Ht 64.0 in | Wt 131.0 lb

## 2024-06-06 DIAGNOSIS — D693 Immune thrombocytopenic purpura: Secondary | ICD-10-CM

## 2024-06-06 LAB — CMP (CANCER CENTER ONLY)
ALT: 27 U/L (ref 0–44)
AST: 27 U/L (ref 15–41)
Albumin: 4 g/dL (ref 3.5–5.0)
Alkaline Phosphatase: 49 U/L (ref 38–126)
Anion gap: 3 — ABNORMAL LOW (ref 5–15)
BUN: 23 mg/dL — ABNORMAL HIGH (ref 6–20)
CO2: 29 mmol/L (ref 22–32)
Calcium: 9 mg/dL (ref 8.9–10.3)
Chloride: 109 mmol/L (ref 98–111)
Creatinine: 0.94 mg/dL (ref 0.44–1.00)
GFR, Estimated: >60 mL/min (ref >60)
Glucose, Bld: 73 mg/dL (ref 70–99)
Potassium: 4.9 mmol/L (ref 3.5–5.1)
Sodium: 141 mmol/L (ref 135–145)
Total Bilirubin: 0.7 mg/dL (ref 0.0–1.2)
Total Protein: 7.6 g/dL (ref 6.5–8.1)

## 2024-06-06 LAB — CBC WITH DIFFERENTIAL (CANCER CENTER ONLY)
Abs Immature Granulocytes: 0.01 K/uL (ref 0.00–0.07)
Basophils Absolute: 0.1 K/uL (ref 0.0–0.1)
Basophils Relative: 1 %
Eosinophils Absolute: 0.1 K/uL (ref 0.0–0.5)
Eosinophils Relative: 2 %
HCT: 38 % (ref 36.0–46.0)
Hemoglobin: 12.5 g/dL (ref 12.0–15.0)
Immature Granulocytes: 0 %
Lymphocytes Relative: 18 %
Lymphs Abs: 0.9 K/uL (ref 0.7–4.0)
MCH: 29.8 pg (ref 26.0–34.0)
MCHC: 32.9 g/dL (ref 30.0–36.0)
MCV: 90.5 fL (ref 80.0–100.0)
Monocytes Absolute: 0.5 K/uL (ref 0.1–1.0)
Monocytes Relative: 10 %
Neutro Abs: 3.5 K/uL (ref 1.7–7.7)
Neutrophils Relative %: 69 %
Platelet Count: 73 K/uL — ABNORMAL LOW (ref 150–400)
RBC: 4.2 MIL/uL (ref 3.87–5.11)
RDW: 13.2 % (ref 11.5–15.5)
WBC Count: 5 K/uL (ref 4.0–10.5)
nRBC: 0 % (ref 0.0–0.2)

## 2024-06-30 DIAGNOSIS — D225 Melanocytic nevi of trunk: Secondary | ICD-10-CM | POA: Diagnosis not present

## 2024-06-30 DIAGNOSIS — L821 Other seborrheic keratosis: Secondary | ICD-10-CM | POA: Diagnosis not present

## 2024-06-30 DIAGNOSIS — L905 Scar conditions and fibrosis of skin: Secondary | ICD-10-CM | POA: Diagnosis not present

## 2024-06-30 DIAGNOSIS — Z85828 Personal history of other malignant neoplasm of skin: Secondary | ICD-10-CM | POA: Diagnosis not present

## 2024-06-30 DIAGNOSIS — D0472 Carcinoma in situ of skin of left lower limb, including hip: Secondary | ICD-10-CM | POA: Diagnosis not present

## 2024-07-09 DIAGNOSIS — D0472 Carcinoma in situ of skin of left lower limb, including hip: Secondary | ICD-10-CM | POA: Diagnosis not present

## 2024-08-13 DIAGNOSIS — J0100 Acute maxillary sinusitis, unspecified: Secondary | ICD-10-CM | POA: Diagnosis not present
